# Patient Record
Sex: Male | Born: 1960 | Race: White | Hispanic: No | Smoking: Never smoker
Health system: Southern US, Community
[De-identification: ages and names within clinical notes are randomized; demographics above are authoritative.]

## PROBLEM LIST (undated history)

## (undated) DIAGNOSIS — I639 Cerebral infarction, unspecified: Secondary | ICD-10-CM

## (undated) DIAGNOSIS — Q2112 Patent foramen ovale: Secondary | ICD-10-CM

## (undated) DIAGNOSIS — E785 Hyperlipidemia, unspecified: Secondary | ICD-10-CM

## (undated) DIAGNOSIS — I10 Essential (primary) hypertension: Secondary | ICD-10-CM

## (undated) DIAGNOSIS — C499 Malignant neoplasm of connective and soft tissue, unspecified: Secondary | ICD-10-CM

## (undated) DIAGNOSIS — Z85528 Personal history of other malignant neoplasm of kidney: Secondary | ICD-10-CM

## (undated) DIAGNOSIS — N289 Disorder of kidney and ureter, unspecified: Secondary | ICD-10-CM

## (undated) DIAGNOSIS — C4490 Unspecified malignant neoplasm of skin, unspecified: Secondary | ICD-10-CM

## (undated) HISTORY — DX: Cerebral infarction, unspecified: I63.9

## (undated) HISTORY — DX: Personal history of other malignant neoplasm of kidney: Z85.528

## (undated) HISTORY — DX: Essential (primary) hypertension: I10

## (undated) HISTORY — DX: Patent foramen ovale: Q21.12

## (undated) HISTORY — DX: Malignant neoplasm of connective and soft tissue, unspecified: C49.9

## (undated) HISTORY — DX: Disorder of kidney and ureter, unspecified: N28.9

## (undated) HISTORY — DX: Hyperlipidemia, unspecified: E78.5

## (undated) HISTORY — PX: PARTIAL NEPHRECTOMY: SHX414

## (undated) HISTORY — PX: OTHER SURGICAL HISTORY: SHX169

## (undated) NOTE — Discharge Instructions (Signed)
 Formatting of this note is different from the original.Images from the original note were not included.[id number] en Transient Ischemic Attack (TIA)  Your symptoms were caused by a TIA, or mini-stroke. Even though your symptoms have gone away, this condition is as serious as a full stroke. It is a warning signal that means once you have had a TIA you are at a higher risk of having a full stroke. The risk of a stroke after a TIA is highest within the first 24 to 48 hours. A TIA is caused when something decreases or blocks blood flow to a part of your brain. A TIA often happens when a blood clot travels to a blood vessel in the brain. The clot reduces or blocks blood flow causing the symptoms you have experienced. After a short while, the clot dissolves. Blood flows again, and the symptoms go away. People with hardening of the arteries (atherosclerosis) or an irregular heartbeat called atrial fibrillation are at higher risk for a TIA. A TIA causes symptoms similar to a stroke, but they last less than 24 hours. A full stroke causes symptoms that last more than 24 hours and may be permanent. But even if your symptoms only lasted a short time, the TIA may have damaged your brain tissue. You will need tests to look at the blood flow to your brain. The tests can also rule out other causes of your symptoms. The tests may include an ultrasound of the arteries in your neck and an evaluation of your heart. They may also include a CT scan of your brain, an MRI scan of your brain, or both. If your healthcare provider finds problems, they will recommend treatment with medicines, procedures, or both. Your provider may prescribe medicines to reduce your chance of having another TIA and stroke. These may include medicines that prevent blood clots, such as antiplatelet medicines and blood thinners (anticoagulants). Your healthcare provider may recommend other treatments. This may include a procedure to open up a blocked  artery in your neck or a procedure to prevent blood clots from forming in the heart. Home care These guidelines will help you take care of yourself at home: ? Take any medicines your healthcare provider has prescribed as directed. These may include antiplatelet medicines or medicines for other conditions, such as high blood pressure or high cholesterol. ? A TIA is a serious event that puts you at risk of having a full stroke. Because of this, it's important to take steps to help prevent a stroke from happening. Your provider will look at all of your risk factors when deciding on what other treatment you may need. Ways to reduce your risk for stroke High blood pressure, diabetes, high cholesterol, heavy drinking, and smoking are risk factors for stroke and heart disease. You can control these by taking medicines and making diet and lifestyle changes. One way to help prevent a stroke is to take aspirin or a similar medicine every day. However, you should not take daily aspirin unless your healthcare provider tells you to. Your provider will work with you to make lifestyle changes to help prevent a stroke. Diet Your healthcare provider will give you information about changes you may need to make to your diet. You may need to see a registered dietitian for help with diet changes. Changes may include: ? Eating less fat and cholesterol ? Eating less salt (sodium). This is especially important if you have high blood pressure. ? Eating more fresh fruits and vegetables ? Eating lean proteins,  such as fish, poultry, beans, and peas ? Eating less red meat and processed meats ? Using low-fat dairy products ? Using vegetable and nut oils in limited amounts ? Limiting how many sweets and processed foods, such as chips, cookies, and baked goods you eat ? Limiting how much alcohol you drink Physical activity Your healthcare provider may recommend that you get more exercise if you have not  been as active as possible. They may suggest that you get 40 minutes of moderate to vigorous physical activity each day. You should do this at least 3 to 4 days a week. A few examples of moderate to vigorous exercise are: ? Walking at a brisk pace, about 3 to 4 miles per hour ? Jogging or running ? Swimming or water aerobics ? Hiking ? Dancing ? Martial arts ? Tennis ? Riding a bike Other ways to reduce your risk ? Weight management. If you are overweight or obese, your healthcare provider will work with you to lose weight and lower your body mass index (BMI) to a normal or near-normal level. Making diet changes and increasing physical activity can help. ? Smoking. If you smoke, break the habit. Enroll in a stop smoking program to improve your chances of success. ? Stress. Learn how to manage your stress. If necessary, work with a financial trader who can help you develop coping strategies that can help you deal with stress at home and at work. Follow-up care Call your healthcare provider for an appointment in the next few days for another evaluation, or as advised. This is to make a plan for preventing another TIA or stroke. You may need to see a neurologist to follow up on your TIA. A neurologist is a healthcare provider who specializes in treating brain and nervous system problems. You may need other tests or procedures. If you had an X-ray, CT scan, MRI scan, or ECG (electrocardiogram), a specialist will review it. You?ll be told of any new findings that will affect your care. Call 911 Call 911 if any of these occur: ? Any of your TIA symptoms return ? New problems with speech, vision, walking, or weakness or numbness of the face or on 1 side of the body ? Severe headache, fainting spell, dizziness, or seizure Use B.E. F.A.S.T. to help you remember the symptoms of a stroke: ? B for balance. Sudden loss of balance or coordination. ? E for eyes. Vision  changes in 1 or both eyes. ? F for face drooping. Drooping or numbness on 1 side of face. This may be more noticeable when you ask the affected person to smile. ? A for arm weakness or numbness. The affected person may have trouble using or lifting 1 side. ? S for speech difficulty. Speech may be slurred or hard to understand. The affected person may also use the wrong words. ? T for time to call 911. Time is critical in treating a stroke. Call 911 as soon as you suspect a stroke has happened--even a small one. The sooner treatment is started the better, even if the symptoms go away. Last Reviewed Date: 12/27/2022 00:00:00  2000-2025 [employer], [place]. All rights reserved. This information is not intended as a substitute for professional medical care. Always follow your healthcare professional's instructions. Electronically signed by Prentice Pollen, PA at 02/11/2024  3:07 PM EST

## (undated) NOTE — Discharge Instructions (Signed)
 Formatting of this note is different from the original.Images from the original note were not included.[id number] Discharge Instructions for Transient Ischemic Attack (TIA) You have been diagnosed with a transient ischemic attack (TIA). A TIA is also known as a mini-stroke. This happens when blood could not reach part of your brain for a short period of time. Unlike a stroke, a TIA does not usually cause lasting damage. If you think you are having symptoms of a TIA or stroke, get medical help right away. Do this even if your symptoms go away. Prevention ? Take your medicines exactly as directed. Don?t skip doses. ? Learn to take your blood pressure. Write down the numbers and tell your doctor. ? Learn your cholesterol level. Follow your doctor?s advice about how to keep cholesterol under control. Make these lifestyle changes: ? Quit smoking. Join a stop-smoking program to improve your chances of success. Ask your doctor about medicines or other methods to help you quit. ? Limit your alcohol. Don't have more than 2 drinks a day if you're a man and no more than 1 drink a day if you're a woman. ? Keep a healthy weight. Get help to lose any extra pounds. If you are overweight, your doctor will work with you to lose weight and lower your body mass index (BMI) to a normal or near-normal level. Making diet changes and increasing physical activity can help. ? Start an exercise program. Ask your doctor how to get started and how much activity you should try to get on a daily or weekly basis. You can benefit from simple activities, such as walking or gardening. ? Learn ways to manage your stress. There are many methods to manage stress. They can help you deal with stress in your home and work life. You may also need to change what you eat. Your doctor may refer you to a registered dietitian for help with diet changes. These changes may include: ? Eating less fat and cholesterol. ? Having less  salt (sodium), especially if you have high blood pressure. ? Eating more fresh vegetables and fruits. ? Eating lean proteins, such as fish, poultry, and legumes (beans and peas). ? Eating less red meat and processed meats. ? Choosing low-fat dairy products. ? Using vegetable and nut oils in small amounts. ? Limiting sweet and processed foods, such as chips, cookies, and baked goods. To cut back on sodium: ? Limit the amount of canned, dried, packaged, and fast foods you eat. ? Don?t add salt to your food. ? Season foods with herbs instead of salt when you cook. Follow-up care If you are taking certain medicines, you may need blood tests to check for progress or problems. Have blood tests as often as prescribed by your doctor. Call 911 Call 911 right away if: ? You have weakness, tingling, or loss of feeling on 1 side of your face or body. ? You have sudden double vision, or trouble seeing in 1 or both eyes. ? You have sudden trouble talking, or slurring your speech. ? You have trouble understanding other people speaking. ? You have sudden, severe headache. ? You have dizziness, loss of balance, a spinning feeling, or a sense of falling. ? You have blackouts. ? You have seizures. B.E. F.A.S.T. is an easy way to remember the signs of stroke. When you see these signs, you know that you need to call 911 fast. B.E. F.A.S.T. stands for: ? B is for balance. Sudden loss of balance or coordination. ? E is  for eyes. Vision changes in 1 or both eyes. ? F is for face drooping. One side of the face is drooping or numb. When the person smiles, the smile is uneven. ? A is for arm weakness. One arm is weak or numb. When the person lifts both arms at the same time, 1 arm may drift downward. ? S is for speech difficulty. You may notice slurred speech or trouble speaking. The person can't repeat a simple sentence correctly when asked. ? T is for time to call 911. If  someone shows any of these symptoms, even if they go away, call 911 right away. Make note of the time the symptoms first appeared. Last Reviewed Date: 05/27/2023 00:00:00  2000-2025 [employer], [place]. All rights reserved. This information is not intended as a substitute for professional medical care. Always follow your healthcare professional's instructions. Electronically signed by Prentice Pollen, PA at 02/11/2024  3:07 PM EST

## (undated) NOTE — ED Provider Notes (Signed)
 Formatting of this note is different from the original.Chief Complaint Patient presents with  Stroke   At 1100 patient suddenly developed dizziness/tunnel vision to both eyes and nausea. Patient has history of CVA x2, Left foot numbness residuals. Patient is a Harry Fleming with history of 2 prior CVAs, hypertension, residual left foot numbness from past stroke as well as right hand numbness from previous traumatic accidents.  He presents for complaint of visual field deficits and dizziness.  He was skiing at around 11 AM when he noticed a sensation of tunnel vision to bilateral eyes as well as dizziness/nausea.  He says this feels persistent now like he does not have good peripheral vision, his central vision he says is normal.  Denies any headache or neck pain, chest pain or difficulty breathing, numbness weakness to the extremities, fever or chills, back pain.Review of Systems All other systems reviewed and are negative.Physical ExamVitals and nursing note reviewed. Constitutional:     General: He is not in acute distress.   Appearance: He is well-developed. HENT:    Head: Normocephalic and atraumatic. Eyes:    General: Lids are normal. Vision grossly intact. Gaze aligned appropriately.    Extraocular Movements: Extraocular movements intact.    Conjunctiva/sclera: Conjunctivae normal.    Pupils: Pupils are equal, round, and reactive to light.    Comments: PERRL, EOMs intact, vision is grossly intact, I do not appreciate any visual field deficits on exam. Cardiovascular:    Rate and Rhythm: Normal rate and regular rhythm.    Heart sounds: No murmur heard.Pulmonary:    Effort: Pulmonary effort is normal. No respiratory distress.    Breath sounds: Normal breath sounds. Abdominal:    Palpations: Abdomen is soft.    Tenderness: There is no abdominal tenderness. Musculoskeletal:       General: No swelling.    Cervical back: Neck supple.  Skin:   General: Skin is warm and dry.    Capillary Refill: Capillary refill takes less than 2 seconds. Neurological:    General: No focal deficit present.    Mental Status: He is alert and oriented to person, place, and time.    GCS: GCS eye subscore is 4. GCS verbal subscore is 5. GCS motor subscore is 6.    Cranial Nerves: Cranial nerves 2-12 are intact. No dysarthria or facial asymmetry.    Sensory: Sensation is intact.    Motor: Motor function is intact. No abnormal muscle tone.    Coordination: Coordination is intact. Finger-Nose-Finger Test normal. Psychiatric:       Mood and Affect: Mood normal. Current Medications:Current Medications[1] Allergies:Allergies[2] Medical History[3]Surgical History[4]Problem List[5]Family History[6]Social History Tobacco Use  Smoking status: Never  Smokeless tobacco: Never Vaping Use  Vaping status: Never Used Substance Use Topics  Alcohol use: Yes   Alcohol/week: 3.0 standard drinks of alcohol   Types: 3 Shots of liquor per week  Drug use: Never Vitals:  02/11/24 1300 02/11/24 1315 02/11/24 1400 02/11/24 1445 BP: 140/87 (!) 137/91 137/90 (!) 142/77 BP Location: Left arm   Left arm Patient Position: Sitting   Sitting Pulse: 79 78 79 81 Resp: 13 15 18 16  Temp:     TempSrc:     SpO2: 98% 97% 99% 96% Weight:     Height:     Medical Decision Making66 year old Fleming with history of stroke presenting for somewhat sudden onset of tunnel vision with sensation of poor peripheral vision bilaterally as well as some dizziness and nausea.  He is minimally hypertensive, I do not  appreciate any objective visual field deficit and there are no other signs or symptoms of stroke on exam, given his history plan for CT CTA head and neck and MRI as well as labs and EKGEKG sinus rhythm with no ischemic changes.  Labs unremarkable for any hematologic or metabolic derangements, no  leukocytosis or anemia, thrombocytopenia, no renal dysfunction or electrolyte derangements.  Troponin negative.  CT head and CTA head and neck negative for any evidence of any significant plaque, stenosis, bleed or other abnormality, MRI of the brain is clear as well without evidence of stroke.  His symptoms have entirely resolved and has no other concerning neurologic deficits.  Certainly could have been caused by TIA or other unspecified visual disturbance however is asymptomatic at this time and do not feel that he requires admission for further evaluation.  He is returning to [facility] which is his home soon and has cardiology, neurology and other specialist to follow-up with.  He is appropriately on aspirin daily and a statin.  He was discharged with strict instructions and return precautions.MR Brain WO Contrast Final Result No acute intracranial pathology. Dictated by: Toribio Loyal  Signed by: Toribio Loyal on 02/11/2024 2:42 PM CT Head And CTA Head Neck [[id number]] Final Result No apparent large vessel occlusion or significant arterial stenosis. Limited evaluation of the petrous and cavernous segments of the ICAs. No apparent intracranial pathology. Dictated by: Alm Sick  Signed by: Alm Sick on 02/11/2024 1:15 PM Consults: ED Course: ED Course as of 02/11/24 1510 Fri Feb 11, 2024 1304 ECG 12 lead80 bpm normal sinus rhythm with no acute ST changes [AS] 1321 Reevaluated patient after CTs, patient has had resolution of his symptoms at this point.  CT CTA head and neck are unremarkable for any LVO, bleed or other acute pathology.  Pending MRI brain [AS] ED Course User Index[AS] Prentice Pollen, PA Diagnoses as of 02/11/24 1510 TIA (transient ischemic attack) Visual disturbance Medications sodium chloride  (NS) 0.9 % flush 5 mL (has no administration in time range) sodium chloride  (NS) 0.9 % flush 5 mL (has no administration in  time range) iohexol (OMNIPaque) 350 MG/ML injection 75 mL (75 mL Intravenous Given 02/11/24 1244) Assessment & PlanTIA (transient ischemic attack)Visual disturbanceED Prescriptions  None      Glasgow Coma Scale Score: 15[1] Current Facility-Administered Medications:   [COMPLETED] Insert peripheral IV, , , Once **AND** [COMPLETED] Saline lock IV, , , Once **AND** sodium chloride  (NS) 0.9 % flush 5 mL, 5 mL, Intravenous, q8h PRN, Prentice Pollen, PA  [COMPLETED] Insert peripheral IV, , , Once **AND** [COMPLETED] Saline lock IV, , , Once **AND** sodium chloride  (NS) 0.9 % flush 5 mL, 5 mL, Intravenous, q8h PRN, Prentice Pollen, PACurrent Outpatient Medications:   aspirin 81 MG EC tablet, Take 81 mg by mouth daily at bedtime., Disp: , Rfl:   atorvastatin  (Lipitor) 80 MG tablet, Take 80 mg by mouth daily at bedtime., Disp: , Rfl:   valsartan  (Diovan ) 160 MG tablet, Take 160 mg by mouth daily at bedtime., Disp: , Rfl: [2] No Known Allergies[3] Past Medical History:Diagnosis Date  Hyperlipidemia   Hypertension   Stroke  (CMS/HHS HCC)  [4] Past Surgical History:Procedure Laterality Date  HAND SURGERY Right   PARTIAL NEPHRECTOMY Left   PATENT FORAMEN OVALE CLOSURE   [5] There is no problem list on file for this patient. [6] No family history on file.Prentice Pollen, PA01/16/26 1510Cosigned by Lorette Lucks, MD at 02/11/2024  4:59 PM ESTElectronically signed by Prentice Pollen, PA at  02/11/2024  3:10 PM ESTElectronically signed by Lorette Lucks, MD at 02/11/2024  4:59 PM EST

---

## 2015-01-27 DIAGNOSIS — G459 Transient cerebral ischemic attack, unspecified: Secondary | ICD-10-CM

## 2015-01-27 HISTORY — DX: Transient cerebral ischemic attack, unspecified: G45.9

## 2015-07-11 ENCOUNTER — Encounter (HOSPITAL_COMMUNITY): Payer: Self-pay | Admitting: Emergency Medicine

## 2015-07-11 ENCOUNTER — Emergency Department (HOSPITAL_COMMUNITY): Payer: 59

## 2015-07-11 ENCOUNTER — Emergency Department (HOSPITAL_COMMUNITY)
Admission: EM | Admit: 2015-07-11 | Discharge: 2015-07-11 | Disposition: A | Discharge status: Home / Self Care | Payer: 59 | Attending: Emergency Medicine | Admitting: Emergency Medicine

## 2015-07-11 DIAGNOSIS — M6281 Muscle weakness (generalized): Secondary | ICD-10-CM | POA: Diagnosis not present

## 2015-07-11 DIAGNOSIS — R51 Headache: Secondary | ICD-10-CM

## 2015-07-11 DIAGNOSIS — I10 Essential (primary) hypertension: Secondary | ICD-10-CM | POA: Insufficient documentation

## 2015-07-11 DIAGNOSIS — G459 Transient cerebral ischemic attack, unspecified: Secondary | ICD-10-CM

## 2015-07-11 DIAGNOSIS — Z85828 Personal history of other malignant neoplasm of skin: Secondary | ICD-10-CM | POA: Insufficient documentation

## 2015-07-11 DIAGNOSIS — R531 Weakness: Secondary | ICD-10-CM

## 2015-07-11 DIAGNOSIS — Z5181 Encounter for therapeutic drug level monitoring: Secondary | ICD-10-CM | POA: Diagnosis not present

## 2015-07-11 DIAGNOSIS — R29898 Other symptoms and signs involving the musculoskeletal system: Secondary | ICD-10-CM

## 2015-07-11 DIAGNOSIS — R2990 Unspecified symptoms and signs involving the nervous system: Secondary | ICD-10-CM

## 2015-07-11 DIAGNOSIS — R200 Anesthesia of skin: Secondary | ICD-10-CM | POA: Diagnosis present

## 2015-07-11 HISTORY — DX: Unspecified malignant neoplasm of skin, unspecified: C44.90

## 2015-07-11 HISTORY — DX: Essential (primary) hypertension: I10

## 2015-07-11 LAB — I-STAT CHEM 8, ED
BUN: 15 mg/dL (ref 6–20)
CHLORIDE: 99 mmol/L — AB (ref 101–111)
CREATININE: 1 mg/dL (ref 0.61–1.24)
Calcium, Ion: 1.16 mmol/L (ref 1.12–1.23)
GLUCOSE: 107 mg/dL — AB (ref 65–99)
HEMATOCRIT: 42 % (ref 39.0–52.0)
HEMOGLOBIN: 14.3 g/dL (ref 13.0–17.0)
POTASSIUM: 4 mmol/L (ref 3.5–5.1)
Sodium: 138 mmol/L (ref 135–145)
TCO2: 27 mmol/L (ref 0–100)

## 2015-07-11 LAB — COMPREHENSIVE METABOLIC PANEL
ALBUMIN: 3.9 g/dL (ref 3.5–5.0)
ALT: 23 U/L (ref 17–63)
AST: 22 U/L (ref 15–41)
Alkaline Phosphatase: 51 U/L (ref 38–126)
Anion gap: 5 (ref 5–15)
BILIRUBIN TOTAL: 0.7 mg/dL (ref 0.3–1.2)
BUN: 13 mg/dL (ref 6–20)
CALCIUM: 9.2 mg/dL (ref 8.9–10.3)
CHLORIDE: 102 mmol/L (ref 101–111)
CO2: 28 mmol/L (ref 22–32)
Creatinine, Ser: 1.07 mg/dL (ref 0.61–1.24)
GFR calc Af Amer: >60 mL/min (ref >60)
Glucose, Bld: 111 mg/dL — ABNORMAL HIGH (ref 65–99)
POTASSIUM: 3.8 mmol/L (ref 3.5–5.1)
Sodium: 135 mmol/L (ref 135–145)
Total Protein: 6.7 g/dL (ref 6.5–8.1)

## 2015-07-11 LAB — CBC
HEMATOCRIT: 40.6 % (ref 39.0–52.0)
HEMOGLOBIN: 13.4 g/dL (ref 13.0–17.0)
MCH: 28.6 pg (ref 26.0–34.0)
MCHC: 33 g/dL (ref 30.0–36.0)
MCV: 86.8 fL (ref 78.0–100.0)
Platelets: 253 10*3/uL (ref 150–400)
RBC: 4.68 MIL/uL (ref 4.22–5.81)
RDW: 13.2 % (ref 11.5–15.5)
WBC: 6 10*3/uL (ref 4.0–10.5)

## 2015-07-11 LAB — I-STAT TROPONIN, ED: Troponin i, poc: 0 ng/mL (ref 0.00–0.08)

## 2015-07-11 LAB — DIFFERENTIAL
BASOS ABS: 0 10*3/uL (ref 0.0–0.1)
Basophils Relative: 1 %
EOS ABS: 0.1 10*3/uL (ref 0.0–0.7)
Eosinophils Relative: 2 %
LYMPHS ABS: 1.9 10*3/uL (ref 0.7–4.0)
LYMPHS PCT: 32 %
MONOS PCT: 10 %
Monocytes Absolute: 0.6 10*3/uL (ref 0.1–1.0)
NEUTROS ABS: 3.4 10*3/uL (ref 1.7–7.7)
NEUTROS PCT: 55 %

## 2015-07-11 LAB — ETHANOL: Alcohol, Ethyl (B): <5 mg/dL (ref <5)

## 2015-07-11 LAB — PROTIME-INR
INR: 1.11 (ref 0.00–1.49)
Prothrombin Time: 14.4 seconds (ref 11.6–15.2)

## 2015-07-11 LAB — APTT: APTT: 29 s (ref 24–37)

## 2015-07-11 LAB — CBG MONITORING, ED: GLUCOSE-CAPILLARY: 112 mg/dL — AB (ref 65–99)

## 2015-07-11 MED ORDER — PROCHLORPERAZINE EDISYLATE 5 MG/ML IJ SOLN: 10.0000 mg | Freq: Once | INTRAMUSCULAR | Status: DC

## 2015-07-11 MED ORDER — DIPHENHYDRAMINE HCL 50 MG/ML IJ SOLN: 25.0000 mg | Freq: Once | INTRAMUSCULAR | Status: DC

## 2015-07-11 MED ORDER — IOPAMIDOL (ISOVUE-370) INJECTION 76%
INTRAVENOUS | Status: AC
  Filled 2015-07-11: qty 100

## 2015-07-11 MED ORDER — IOPAMIDOL (ISOVUE-370) INJECTION 76%
75.0000 mL | Freq: Once | INTRAVENOUS | Status: AC | PRN
  Administered 2015-07-11: 75 mL via INTRAVENOUS

## 2015-07-11 MED ORDER — IOHEXOL 300 MG/ML  SOLN: 75.0000 mL | Freq: Once | INTRAMUSCULAR | Status: DC | PRN

## 2015-07-11 NOTE — Code Documentation (Signed)
55 year old presents to [facility] via [place] from PCP office with stroke like symptoms.  On arrival code stroke was called by EDP.  Patient reports he was driving when he had a sudden onset of left arm numbness - he was near his MD office so he stopped there.  When getting out of car left leg felt weak - difficulty walking and dizzy.  Then his left hand began to tingle.  His NIHSS is 3 - left arm drift when eyes shut per Dr. Cheral Marker exam - left leg drift and left side sensory decreased.  BP 146/95 HR 73 SR - CBG 112.  Denies hx of migraines - no hx afib.  To MRI for DWI - Dr. Cheral Marker speaking with family and patient re: tPA.  Dr. Cheral Marker to MRI and reports  DWI negative - will continue with MRA head only per Dr. Cheral Marker.  Back to ED - Dr. Cheral Marker cancelled code stroke.   Handoff to [employer].  To call as needed.

## 2015-07-11 NOTE — ED Provider Notes (Signed)
CSN: [id number]     Arrival date & time 07/11/15  1608 History  By signing my name below, I, [facility], attest that this documentation has been prepared under the direction and in the presence of Deno Etienne, DO. Electronically Signed: Virgel Bouquet, ED Scribe. 07/11/2015. 7:12 PM.   Chief Complaint  Patient presents with  . Numbness    left arm    The history is provided by the patient. No language interpreter was used.   HPI Comments: Angel Adams is a 55 y.o. male with an hx of HTN who presents to the Emergency Department complaining of constant, gradually improving, waxing and waning, left arm numbness that radiates down his entire arm onset 1.25 hours ago. He describes his left arm as feeling heavy with tingling and pressure. He reports associated dizziness that he describes as feeling off balance, nausea, and HA en route to the ED today. Dizziness is unchanged by movement.  Denies recent falls or injuries. Denies taking blood thinners. Denies left arm weakness, neck pain, tinnitus, or any other symptoms currently.  Past Medical History  Diagnosis Date  . Hypertension   . Skin cancer    Past Surgical History  Procedure Laterality Date  . Mass removed from shoulder     No family history on file. Social History  Substance Use Topics  . Smoking status: Never Smoker   . Smokeless tobacco: None  . Alcohol Use: Yes     Comment: occassional    Review of Systems  Constitutional: Negative for fever and chills.  HENT: Negative for congestion, facial swelling and tinnitus.   Eyes: Negative for discharge and visual disturbance.  Respiratory: Negative for shortness of breath.   Cardiovascular: Negative for chest pain and palpitations.  Gastrointestinal: Positive for nausea. Negative for vomiting, abdominal pain and diarrhea.  Musculoskeletal: Negative for myalgias, arthralgias and neck pain.  Skin: Negative for color change and rash.  Neurological: Positive for  dizziness, numbness and headaches. Negative for tremors, syncope and weakness.  Psychiatric/Behavioral: Negative for confusion and dysphoric mood.  All other systems reviewed and are negative.     Allergies  Review of patient's allergies indicates no known allergies.  Home Medications   Prior to Admission medications   Not on File   BP 134/101 mmHg  Pulse 73  Temp(Src) 97.4 F (36.3 C) (Oral)  Resp 20  SpO2 98% Physical Exam  Constitutional: He is oriented to person, place, and time. He appears well-developed and well-nourished.  HENT:  Head: Normocephalic and atraumatic.  Eyes: EOM are normal. Pupils are equal, round, and reactive to light.  Neck: Normal range of motion. Neck supple. No JVD present.  Cardiovascular: Normal rate and regular rhythm.  Exam reveals no gallop and no friction rub.   No murmur heard. Normal rhythm.  Pulmonary/Chest: No respiratory distress. He has no wheezes.  Abdominal: He exhibits no distension. There is no rebound and no guarding.  Musculoskeletal: Normal range of motion.  Neurological: He is alert and oriented to person, place, and time.  4/5 strength in left upper and lower extremities. Reflexes 2+.  Skin: No rash noted. No pallor.  Psychiatric: He has a normal mood and affect. His behavior is normal.  Nursing note and vitals reviewed.   ED Course  Procedures (including critical care time)  DIAGNOSTIC STUDIES: Oxygen Saturation is 98% on RA, normal by my interpretation.    COORDINATION OF CARE: 4:13 PM Called code stroke. Will order head CT and labs. Discussed treatment plan with  pt at bedside and pt agreed to plan.  6:14 PM Consulted with Neurology Dr. Cheral Marker who advised that the pt did not need to be admitted to the hospital for further evaluation. Will discharge pt and advise follow-up with his PCP and Neurology. Will return to discuss treatment plan with pt.  Labs Review Labs Reviewed  COMPREHENSIVE METABOLIC PANEL - Abnormal;  Notable for the following:    Glucose, Bld 111 (*)    All other components within normal limits  I-STAT CHEM 8, ED - Abnormal; Notable for the following:    Chloride 99 (*)    Glucose, Bld 107 (*)    All other components within normal limits  CBG MONITORING, ED - Abnormal; Notable for the following:    Glucose-Capillary 112 (*)    All other components within normal limits  ETHANOL  PROTIME-INR  APTT  CBC  DIFFERENTIAL  URINE RAPID DRUG SCREEN, HOSP PERFORMED  URINALYSIS, ROUTINE W REFLEX MICROSCOPIC (NOT AT [facility])  I-STAT TROPOININ, ED    Imaging Review Ct Angio Neck W/cm &/or Wo/cm  07/11/2015  CLINICAL DATA:  Left arm weakness.  Code stroke. EXAM: CT ANGIOGRAPHY NECK TECHNIQUE: Multidetector CT imaging of the neck was performed using the standard protocol during bolus administration of intravenous contrast. Multiplanar CT image reconstructions and MIPs were obtained to evaluate the vascular anatomy. Carotid stenosis measurements (when applicable) are obtained utilizing NASCET criteria, using the distal internal carotid diameter as the denominator. CONTRAST:  75 mL Isovue 370 IV COMPARISON:  CT head and MRI head 07/11/2015 FINDINGS: Aortic arch: Normal aortic arch.  Proximal great vessels are normal. Right carotid system: Normal right carotid. Negative for atherosclerotic disease or dissection. Tortuosity of the cervical internal carotid. Left carotid system: Normal left carotid. Negative for atherosclerotic disease or dissection. Tortuosity of the internal carotid artery. Vertebral arteries:Normal vertebral artery bilaterally without stenosis or dissection. Skeleton: Negative Other neck: No mass or adenopathy in the neck. Paranasal sinuses clear. IMPRESSION: Negative CTA neck. Electronically Signed   By: Franchot Gallo M.D.   On: 07/11/2015 18:59   Mr Jodene Nam Head Wo Contrast  07/11/2015  CLINICAL DATA:  Stroke-like symptoms.  Left arm numbness EXAM: MRI HEAD WITHOUT CONTRAST MRA HEAD WITHOUT  CONTRAST TECHNIQUE: Multiplanar, multiecho pulse sequences of the brain and surrounding structures were obtained without intravenous contrast. Angiographic images of the head were obtained using MRA technique without contrast. COMPARISON:  CT 07/11/2015 FINDINGS: MRI HEAD FINDINGS Imaging limited to diffusion weighted imaging only. Negative for acute infarct. Normal ventricle size. Normal diffusion-weighted imaging. MRA HEAD FINDINGS Both vertebral arteries appear normal. PICA, superior cerebellar, posterior cerebral arteries appear normal. Basilar is normal Internal carotid artery is normal bilaterally. Anterior and middle cerebral arteries are normal bilaterally Negative for cerebral aneurysm. IMPRESSION: Negative for acute infarct.  Limited imaging of the brain. Normal MRA circle of Willis. Electronically Signed   By: Franchot Gallo M.D.   On: 07/11/2015 18:11   Mr Brain Ltd W/o Cm  07/11/2015  CLINICAL DATA:  Stroke-like symptoms.  Left arm numbness EXAM: MRI HEAD WITHOUT CONTRAST MRA HEAD WITHOUT CONTRAST TECHNIQUE: Multiplanar, multiecho pulse sequences of the brain and surrounding structures were obtained without intravenous contrast. Angiographic images of the head were obtained using MRA technique without contrast. COMPARISON:  CT 07/11/2015 FINDINGS: MRI HEAD FINDINGS Imaging limited to diffusion weighted imaging only. Negative for acute infarct. Normal ventricle size. Normal diffusion-weighted imaging. MRA HEAD FINDINGS Both vertebral arteries appear normal. PICA, superior cerebellar, posterior cerebral arteries appear normal.  Basilar is normal Internal carotid artery is normal bilaterally. Anterior and middle cerebral arteries are normal bilaterally Negative for cerebral aneurysm. IMPRESSION: Negative for acute infarct.  Limited imaging of the brain. Normal MRA circle of Willis. Electronically Signed   By: Franchot Gallo M.D.   On: 07/11/2015 18:11   Ct Head Code Stroke Wo Contrast  07/11/2015   CLINICAL DATA:  Code stroke, dizziness and RIGHT arm weakness onset at 1500 hours today, history hypertension EXAM: CT HEAD WITHOUT CONTRAST TECHNIQUE: Contiguous axial images were obtained from the base of the skull through the vertex without intravenous contrast. COMPARISON:  None FINDINGS: Normal ventricular morphology. No midline shift or mass effect. Minimal small vessel chronic ischemic changes of deep cerebral white matter superiorly. Otherwise normal appearance of brain parenchyma. No intracranial hemorrhage, mass lesion or evidence acute infarction. No extra-axial fluid collections. Visualized paranasal sinuses and mastoid air cells clear. Bones unremarkable. IMPRESSION: Minimal small vessel chronic ischemic changes of deep cerebral white matter. No acute intracranial abnormalities. Findings called to Dr.  Tyrone Nine On 07/11/2015 at 1652 hours. Electronically Signed   By: Lavonia Dana M.D.   On: 07/11/2015 17:07   I have personally reviewed and evaluated these images and lab results as part of my medical decision-making.   MDM   Final diagnoses:  Left arm weakness    55 yo M With left arm and left leg weakness. Feels like it's when he got a block for shoulder surgery. Denies any injury. Occurred suddenly. Code stroke was initiated. He was seen by neurology. Deemed to not be a stroke negative MRI MRA of the head. Neurology recommends a CT angiogram of the neck to evaluate for possible dissection. They feel that this is likely a complex migraine as the patient also had a headache with the symptoms. On my reassessment the patient is feeling much better. No longer complaining of any weakness or numbness to the arm. CT angiogram of the neck was negative. Discussed inpatient vs outpatient therapy. Will discharge the patient home. Given follow-up for possible outpatient TIA workup. Also given neurology contact if this reoccurs.   7:22 PM:  I have discussed the diagnosis/risks/treatment options with the  patient and family and believe the pt to be eligible for discharge home to follow-up with PCP, neuro. We also discussed returning to the ED immediately if new or worsening sx occur. We discussed the sx which are most concerning (e.g., sudden worsening pain, weakness, other stroke like symptoms) that necessitate immediate return. Medications administered to the patient during their visit and any new prescriptions provided to the patient are listed below.  Medications given during this visit Medications  prochlorperazine (COMPAZINE) injection 10 mg (not administered)  diphenhydrAMINE (BENADRYL) injection 25 mg (not administered)  iohexol (OMNIPAQUE) 300 MG/ML solution 75 mL (not administered)  iopamidol (ISOVUE-370) 76 % injection (not administered)  iopamidol (ISOVUE-370) 76 % injection 75 mL (75 mLs Intravenous Contrast Given 07/11/15 1830)    New Prescriptions   No medications on file    The patient appears reasonably screen and/or stabilized for discharge and I doubt any other medical condition or other [facility] requiring further screening, evaluation, or treatment in the ED at this time prior to discharge.    I personally performed the services described in this documentation, which was scribed in my presence. The recorded information has been reviewed and is accurate.     Deno Etienne, DO 07/11/15 [place]

## 2015-07-11 NOTE — ED Notes (Signed)
EMS - Patient c/o of numbness and tingling and pressure in the left arm.  Stroke screen passed.  LKN at 1500, discovered at 1500.  Patient was driving when the symptoms started.  No recent trauma.  Denies SOB, nausea and vomiting.

## 2015-07-11 NOTE — ED Notes (Signed)
Placed patient into gown and on monitor

## 2015-07-11 NOTE — ED Notes (Signed)
Patient alert and oriented at discharge, family present.  Patient assisted to the waiting room via wheelchair.  Patient denies pain at time at discharge.

## 2015-07-11 NOTE — Discharge Instructions (Signed)
He may have had a complicated migraine. The neurologist that saw you did not feel like this was a stroke. To be thorough you likely need to have a mini stroke workup. This typically consists of a Holter monitor, carotid duplex studies, and an echocardiogram. Discussed this with your family physician. Call and talk to them tomorrow.

## 2015-07-11 NOTE — Consult Note (Signed)
NEURO HOSPITALIST CONSULT NOTE   Requestig physician: Dr. Tyrone Nine  Reason for Consult: Acute onset of LUE weakness  History obtained from:  Patient and Chart     HPI:                                                                                                                                          Angel Adams is an 55 y.o. male who presented with LUE and LLE numbness and weakness. Time of onset was 2:55 PM. He was unsteady when ambulating due to the leg weakness, but denies vertigo or presyncope. Also with some left facial numbness. Denies speech deficit or vision loss. He described to the ED team his left arm as "feeling heavy with tingling and pressure". He reported associated dizziness that he described as feeling off balance due to the weakness, but again, without a spinning sensation. Endorsed nausea, and HA en route to the ED.   States that he has a prior history of HTN. No prior history of stroke, ICH, CAD or DM.   Past Medical History  Diagnosis Date  . Hypertension   . Skin cancer     Past Surgical History  Procedure Laterality Date  . Mass removed from shoulder      No family history on file.  Social History:  reports that he has never smoked. He does not have any smokeless tobacco history on file. He reports that he drinks alcohol. His drug history is not on file.  No Known Allergies  MEDICATIONS:                                                                                                                     Endorses taking an ARB for his HTN.  ROS:  History obtained from the patient  General ROS: negative for - chills, fatigue, fever, night sweats, weight gain or weight loss Psychological ROS: negative for - behavioral disorder, hallucinations, memory difficulties, mood swings or suicidal  ideation Ophthalmic ROS: negative for - blurry vision, double vision, eye pain or loss of vision ENT ROS: negative for - epistaxis, nasal discharge, oral lesions, sore throat, tinnitus or vertigo Allergy and Immunology ROS: negative for - hives or itchy/watery eyes Hematological and Lymphatic ROS: negative for - bleeding problems, bruising or swollen lymph nodes Endocrine ROS: negative for - galactorrhea, hair pattern changes, polydipsia/polyuria or temperature intolerance Respiratory ROS: negative for - cough, hemoptysis, shortness of breath or wheezing Cardiovascular ROS: negative for - chest pain, dyspnea on exertion, edema or irregular heartbeat Gastrointestinal ROS: negative for - abdominal pain, diarrhea, hematemesis, nausea/vomiting or stool incontinence Genito-Urinary ROS: negative for - dysuria, hematuria, incontinence or urinary frequency/urgency Musculoskeletal ROS: negative for - joint swelling or muscular weakness Neurological ROS: as noted in HPI Dermatological ROS: negative for rash and skin lesion changes  Blood pressure 143/94, pulse 67, temperature 97.4 F (36.3 C), temperature source Oral, resp. rate 13, SpO2 99 %.  Neurologic Examination:                                                                                                      HEENT-  Normocephalic, no lesions, without obvious abnormality.  Normal external eye and conjunctiva.   Lungs- No gross wheezes.  Abdomen- Nondistended.  Extremities- No edema.   Neurological Examination Mental Status: Alert, oriented, thought content appropriate.  Tearful. Appears anxious. Speech fluent without evidence of aphasia.  Able to follow all commands without difficulty. Cranial Nerves: II: Visual fields normal, PERRL III,IV, VI: ptosis not present, extra-ocular motions intact bilaterally V,VII: smile symmetric, subjective decreased temperature and FT sensation to left side of face.  VIII: hearing intact to  conversation IX,X: No hypophonia noted.  XI: Symmetric XII: midline tongue extension Motor: Right : Upper extremity   5/5    Left:     Upper extremity   4+5  Lower extremity   5/5     Lower extremity   4+/5 Tone and bulk:normal tone throughout; no atrophy noted Sensory: Decreased PP and FT to LUE and LLE.  Deep Tendon Reflexes: 2+ and symmetric throughout Plantars: Right: downgoing   Left: downgoing Cerebellar: No ataxia on FNF bilaterally.  Gait: Deferred.   NIHSS = 3.    Lab Results: Basic Metabolic Panel:  Recent Labs Lab 07/11/15 1633 07/11/15 1648  NA 135 138  K 3.8 4.0  CL 102 99*  CO2 28  --   GLUCOSE 111* 107*  BUN 13 15  CREATININE 1.07 1.00  CALCIUM 9.2  --     Liver Function Tests:  Recent Labs Lab 07/11/15 1633  AST 22  ALT 23  ALKPHOS 51  BILITOT 0.7  PROT 6.7  ALBUMIN 3.9   No results for input(s): LIPASE, AMYLASE in the last 168 hours. No results for input(s): AMMONIA in the last 168 hours.  CBC:  Recent Labs  Lab 07/11/15 1633 07/11/15 1648  WBC 6.0  --   NEUTROABS 3.4  --   HGB 13.4 14.3  HCT 40.6 42.0  MCV 86.8  --   PLT 253  --     Cardiac Enzymes: No results for input(s): CKTOTAL, CKMB, CKMBINDEX, TROPONINI in the last 168 hours.  Lipid Panel: No results for input(s): CHOL, TRIG, HDL, CHOLHDL, VLDL, LDLCALC in the last 168 hours.  CBG:  Recent Labs Lab 07/11/15 1640  GLUCAP 112*    Microbiology: No results found for this or any previous visit.  Coagulation Studies:  Recent Labs  07/11/15 1633  LABPROT 14.4  INR 1.11    Imaging: Ct Angio Neck W/cm &/or Wo/cm  07/11/2015  CLINICAL DATA:  Left arm weakness.  Code stroke. EXAM: CT ANGIOGRAPHY NECK TECHNIQUE: Multidetector CT imaging of the neck was performed using the standard protocol during bolus administration of intravenous contrast. Multiplanar CT image reconstructions and MIPs were obtained to evaluate the vascular anatomy. Carotid stenosis measurements  (when applicable) are obtained utilizing NASCET criteria, using the distal internal carotid diameter as the denominator. CONTRAST:  75 mL Isovue 370 IV COMPARISON:  CT head and MRI head 07/11/2015 FINDINGS: Aortic arch: Normal aortic arch.  Proximal great vessels are normal. Right carotid system: Normal right carotid. Negative for atherosclerotic disease or dissection. Tortuosity of the cervical internal carotid. Left carotid system: Normal left carotid. Negative for atherosclerotic disease or dissection. Tortuosity of the internal carotid artery. Vertebral arteries:Normal vertebral artery bilaterally without stenosis or dissection. Skeleton: Negative Other neck: No mass or adenopathy in the neck. Paranasal sinuses clear. IMPRESSION: Negative CTA neck. Electronically Signed   By: Franchot Gallo M.D.   On: 07/11/2015 18:59   Mr Jodene Nam Head Wo Contrast  07/11/2015  CLINICAL DATA:  Stroke-like symptoms.  Left arm numbness EXAM: MRI HEAD WITHOUT CONTRAST MRA HEAD WITHOUT CONTRAST TECHNIQUE: Multiplanar, multiecho pulse sequences of the brain and surrounding structures were obtained without intravenous contrast. Angiographic images of the head were obtained using MRA technique without contrast. COMPARISON:  CT 07/11/2015 FINDINGS: MRI HEAD FINDINGS Imaging limited to diffusion weighted imaging only. Negative for acute infarct. Normal ventricle size. Normal diffusion-weighted imaging. MRA HEAD FINDINGS Both vertebral arteries appear normal. PICA, superior cerebellar, posterior cerebral arteries appear normal. Basilar is normal Internal carotid artery is normal bilaterally. Anterior and middle cerebral arteries are normal bilaterally Negative for cerebral aneurysm. IMPRESSION: Negative for acute infarct.  Limited imaging of the brain. Normal MRA circle of Willis. Electronically Signed   By: Franchot Gallo M.D.   On: 07/11/2015 18:11   Mr Brain Ltd W/o Cm  07/11/2015  CLINICAL DATA:  Stroke-like symptoms.  Left arm  numbness EXAM: MRI HEAD WITHOUT CONTRAST MRA HEAD WITHOUT CONTRAST TECHNIQUE: Multiplanar, multiecho pulse sequences of the brain and surrounding structures were obtained without intravenous contrast. Angiographic images of the head were obtained using MRA technique without contrast. COMPARISON:  CT 07/11/2015 FINDINGS: MRI HEAD FINDINGS Imaging limited to diffusion weighted imaging only. Negative for acute infarct. Normal ventricle size. Normal diffusion-weighted imaging. MRA HEAD FINDINGS Both vertebral arteries appear normal. PICA, superior cerebellar, posterior cerebral arteries appear normal. Basilar is normal Internal carotid artery is normal bilaterally. Anterior and middle cerebral arteries are normal bilaterally Negative for cerebral aneurysm. IMPRESSION: Negative for acute infarct.  Limited imaging of the brain. Normal MRA circle of Willis. Electronically Signed   By: Franchot Gallo M.D.   On: 07/11/2015 18:11   Ct Head Code Stroke Wo Contrast  07/11/2015  CLINICAL DATA:  Code stroke, dizziness and RIGHT arm weakness onset at 1500 hours today, history hypertension EXAM: CT HEAD WITHOUT CONTRAST TECHNIQUE: Contiguous axial images were obtained from the base of the skull through the vertex without intravenous contrast. COMPARISON:  None FINDINGS: Normal ventricular morphology. No midline shift or mass effect. Minimal small vessel chronic ischemic changes of deep cerebral white matter superiorly. Otherwise normal appearance of brain parenchyma. No intracranial hemorrhage, mass lesion or evidence acute infarction. No extra-axial fluid collections. Visualized paranasal sinuses and mastoid air cells clear. Bones unremarkable. IMPRESSION: Minimal small vessel chronic ischemic changes of deep cerebral white matter. No acute intracranial abnormalities. Findings called to Dr.  Tyrone Nine On 07/11/2015 at 1652 hours. Electronically Signed   By: Lavonia Dana M.D.   On: 07/11/2015 17:07   Impression: 1. Acute onset of  left sided sensory numbness/paresthesia, with mild left sided weakness. While still experiencing symptoms, his MRI brain revealed no DWI abnormality. MRA of head revealed no proximal/distal occlusion or stenosis. CTA of neck was normal. Given negative imaging findings and low NIHSS score of 3, IV tPA risks were felt to significantly outweigh potential benefits, and therefore administration was not indicated. Discussed with patient and his wife, who expressed understanding and agreement with the plan.  2. DDx for his weakness includes complicated migraine (first episode), conversion disorder and anxiety-related symptoms.     Recommendations: 1. Following imaging studies, discussed overnight observation as an option with the ED staff.  2. The patient has been discharged from the ED. If symptoms recur, he should be reevaluated.   Electronically signed: Dr. Kerney Elbe 07/11/2015, 10:28 PM

## 2015-09-27 DIAGNOSIS — Z8774 Personal history of (corrected) congenital malformations of heart and circulatory system: Secondary | ICD-10-CM

## 2015-09-27 HISTORY — DX: Personal history of (corrected) congenital malformations of heart and circulatory system: Z87.74

## 2016-11-09 ENCOUNTER — Inpatient Hospital Stay: Admit: 2016-11-09 | Discharge: 2016-11-09 | Disposition: A | Discharge status: Home / Self Care | Payer: Self-pay

## 2016-11-22 IMAGING — MR MR HEAD W/O CM
5 series · 39 of 48 positions shown · non-contrast
Comparison: CT 07/11/2015

CLINICAL DATA: Stroke-like symptoms.  Left arm numbness

EXAM:
MRI HEAD WITHOUT CONTRAST
MRA HEAD WITHOUT CONTRAST
TECHNIQUE: Multiplanar, multiecho pulse sequences of the brain and surrounding
structures were obtained without intravenous contrast. Angiographic
images of the head were obtained using MRA technique without
contrast.

[Series 3: DWI · axial · 3.0mm · 0.94mm/px · z∈[-58,+88]mm · 11 of 100 slices shown (1 of 2)]
[im 1/100]
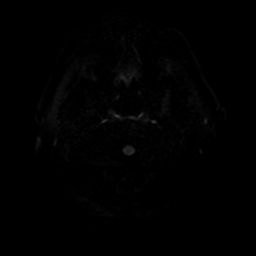
[im 10/100]
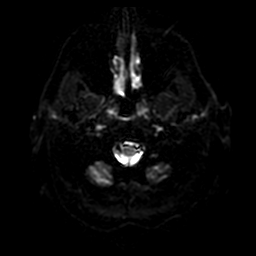
[im 20/100]
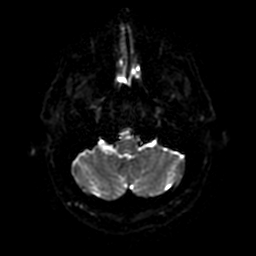
[im 30/100]
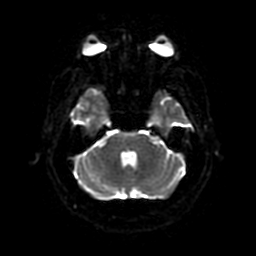
[im 40/100]
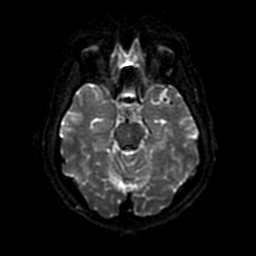
[im 50/100]
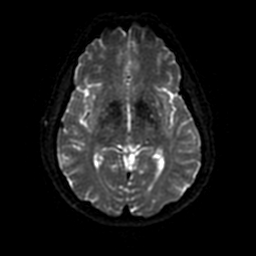
[im 60/100]
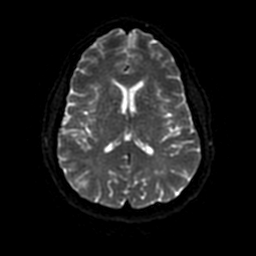
[im 70/100]
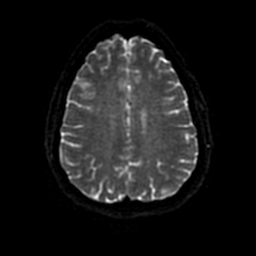
[im 80/100]
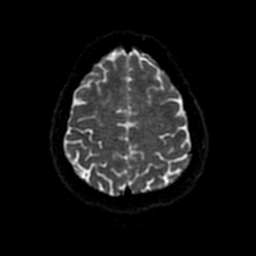
[im 90/100]
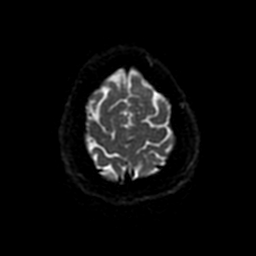
[im 100/100]
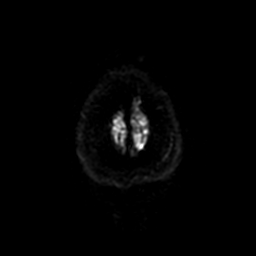

[Series 4: DWI · coronal · 4.0mm · 0.94mm/px · 8 of 70 slices shown (2 of 2)]
[im 1/70]
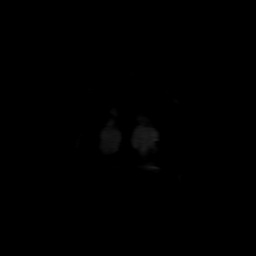
[im 10/70]
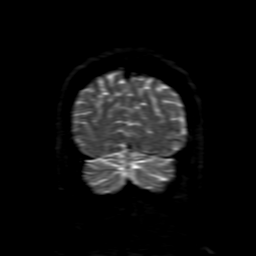
[im 20/70]
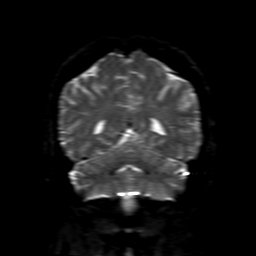
[im 30/70]
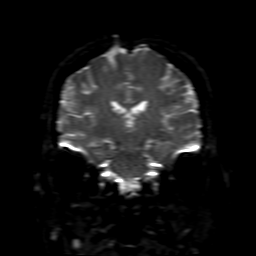
[im 40/70]
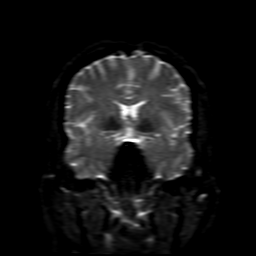
[im 50/70]
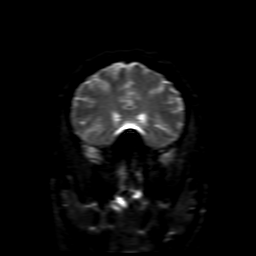
[im 60/70]
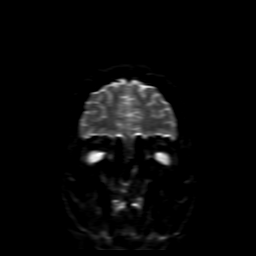
[im 70/70]
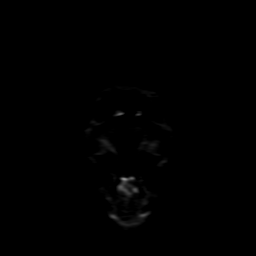

[Series 6: ax (id) 2 · axial · 1.0mm · 0.43mm/px · z∈[-75,+11]mm · 11 of 184 slices shown]
[im 1/184]
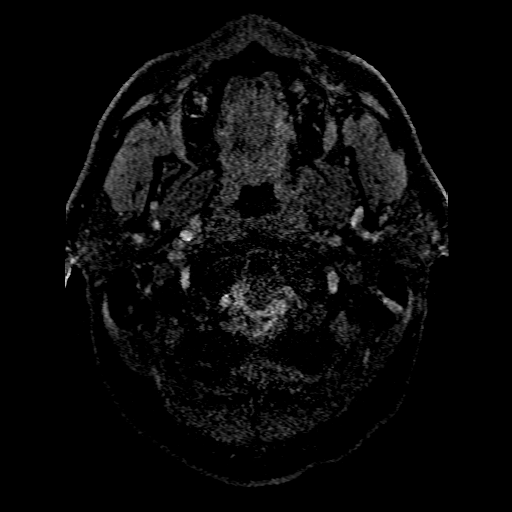
[im 10/184]
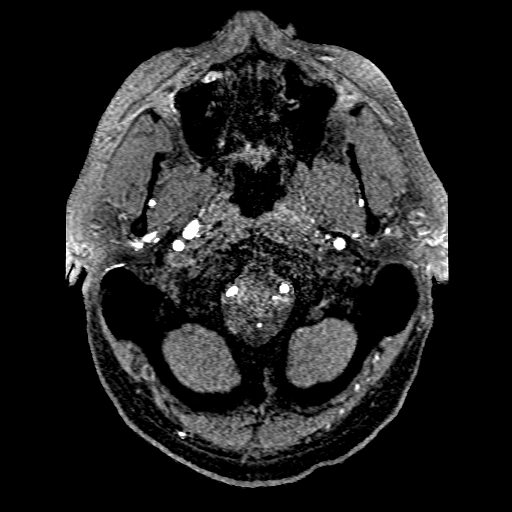
[im 20/184]
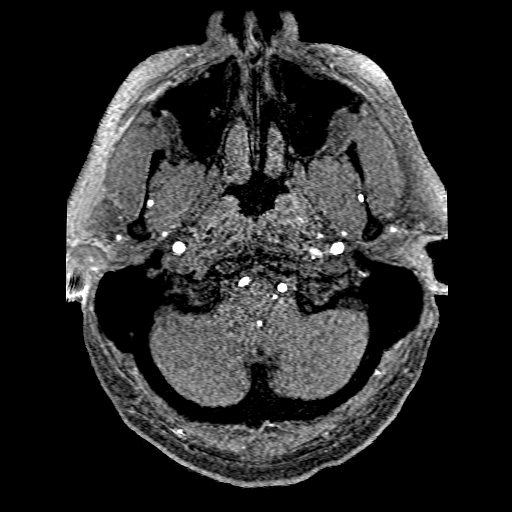
[im 29/184]
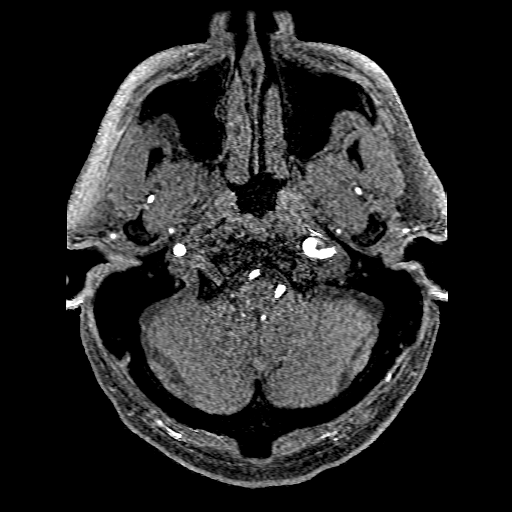
[im 39/184]
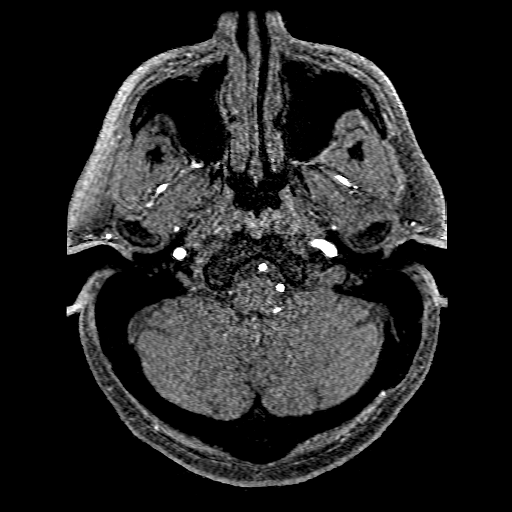
[im 58/184]
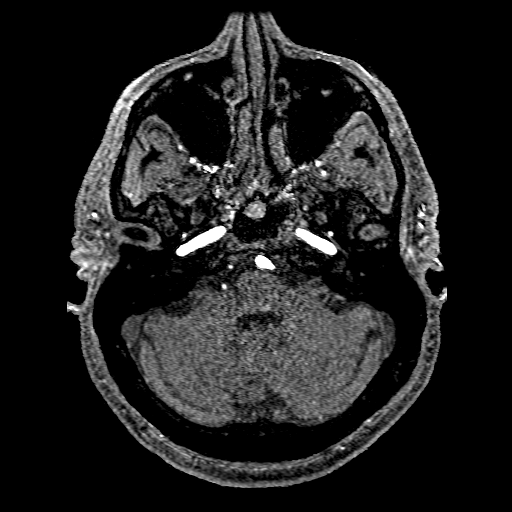
[im 78/184]
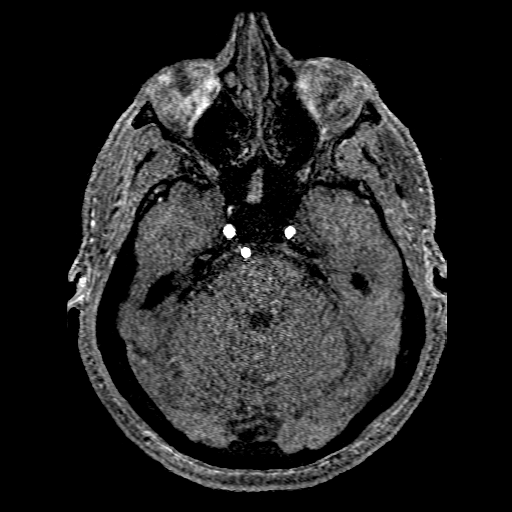
[im 106/184]
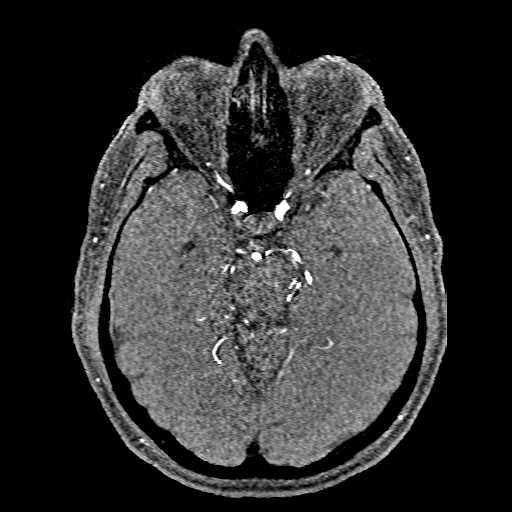
[im 126/184]
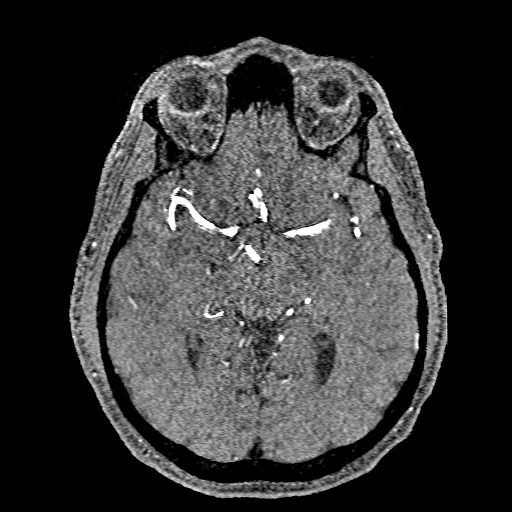
[im 155/184]
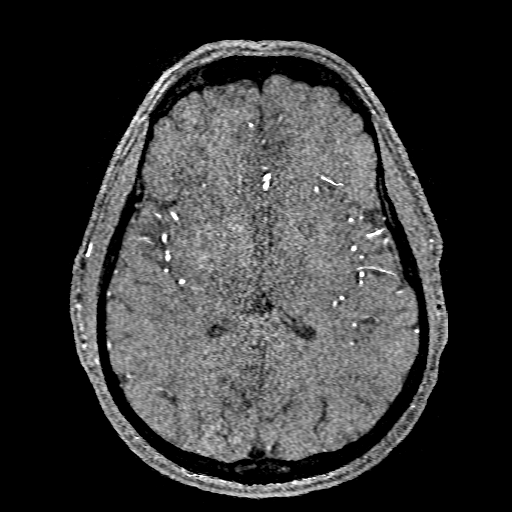
[im 174/184]
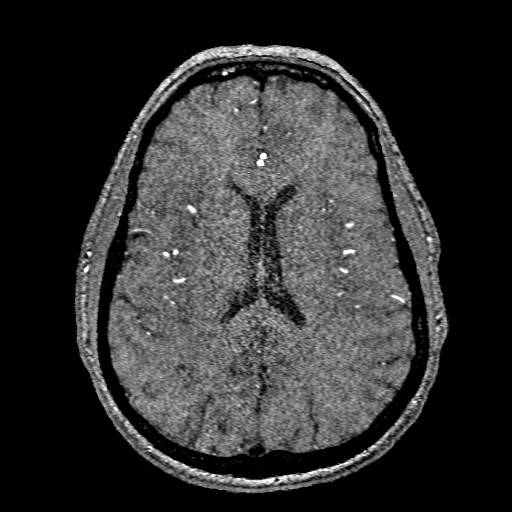

[Series 350: ADC · axial · 3.0mm · 0.94mm/px · z∈[-58,+88]mm · 5 of 50 slices shown (1 of 2)]
[im 1/50]
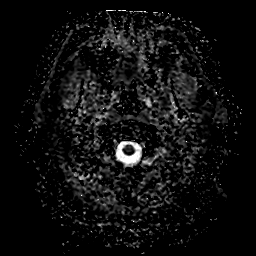
[im 13/50]
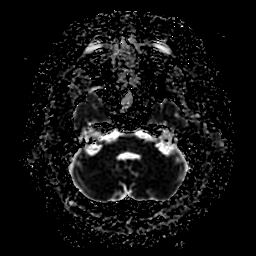
[im 25/50]
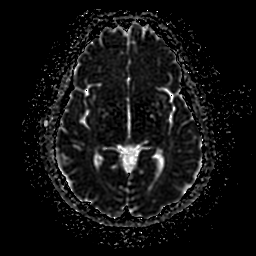
[im 37/50]
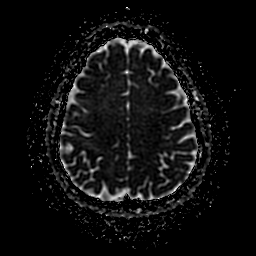
[im 50/50]
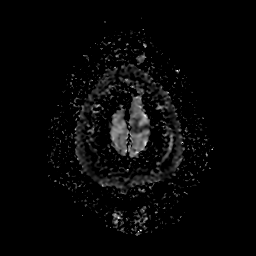

[Series 450: ADC · coronal · 4.0mm · 0.94mm/px · 4 of 35 slices shown (2 of 2)]
[im 1/35]
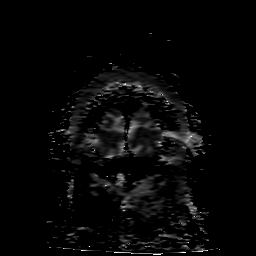
[im 12/35]
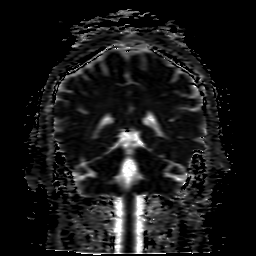
[im 23/35]
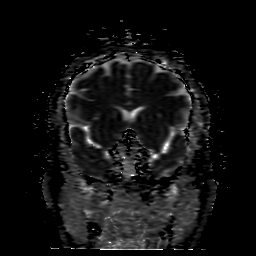
[im 35/35]
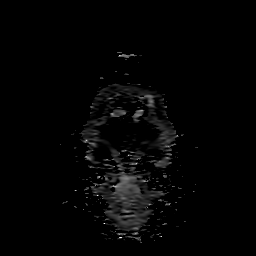

[39 of 48 positions shown; findings below may reference images not displayed]

FINDINGS: MRI HEAD FINDINGS

Imaging limited to diffusion weighted imaging only.

Negative for acute infarct. Normal ventricle size. Normal
diffusion-weighted imaging.

MRA HEAD FINDINGS

Both vertebral arteries appear normal. PICA, superior cerebellar,
posterior cerebral arteries appear normal. Basilar is normal

Internal carotid artery is normal bilaterally. Anterior and middle
cerebral arteries are normal bilaterally

Negative for cerebral aneurysm.
IMPRESSION: Negative for acute infarct.  Limited imaging of the brain.

Normal MRA circle of Willis.

## 2018-08-14 ENCOUNTER — Inpatient Hospital Stay: Admit: 2018-08-14 | Discharge: 2018-08-14 | Disposition: A | Discharge status: Home / Self Care | Payer: Self-pay

## 2018-12-19 ENCOUNTER — Inpatient Hospital Stay: Admit: 2018-12-19 | Discharge: 2018-12-19 | Disposition: A | Discharge status: Home / Self Care | Payer: Self-pay

## 2019-02-09 MED ORDER — PRAVASTATIN 40 MG TABLET
40 | ORAL_TABLET | Freq: Every evening | ORAL | 4 refills | 90.00000 days | Status: AC
Start: 2019-02-09 — End: 2020-05-15

## 2019-02-24 MED ORDER — VALSARTAN 160 MG TABLET
160 | ORAL_TABLET | ORAL | 4 refills | 90.00000 days | Status: AC
Start: 2019-02-24 — End: 2020-04-22

## 2019-03-24 MED ORDER — BUTALBITAL-ACETAMINOPHEN-CAFFEINE 50 MG-325 MG-40 MG TABLET
50-325-40 | ORAL | 1.00 refills | 5.00000 days | Status: AC
Start: 2019-03-24 — End: 2019-03-24

## 2019-04-22 ENCOUNTER — Ambulatory Visit: Admit: 2019-04-22 | Payer: BLUE CROSS/BLUE SHIELD

## 2019-04-23 DIAGNOSIS — Z23 Encounter for immunization: Secondary | ICD-10-CM

## 2019-05-13 ENCOUNTER — Ambulatory Visit: Admit: 2019-05-13 | Payer: PRIVATE HEALTH INSURANCE

## 2019-05-14 DIAGNOSIS — Z23 Encounter for immunization: Secondary | ICD-10-CM

## 2019-05-30 ENCOUNTER — Encounter: Admit: 2019-05-30 | Payer: PRIVATE HEALTH INSURANCE | Attending: Urology

## 2019-05-31 ENCOUNTER — Encounter: Admit: 2019-05-31 | Payer: PRIVATE HEALTH INSURANCE | Attending: Adult Health

## 2019-05-31 DIAGNOSIS — Z01818 Encounter for other preprocedural examination: Secondary | ICD-10-CM

## 2019-06-06 ENCOUNTER — Encounter: Admit: 2019-06-06 | Payer: PRIVATE HEALTH INSURANCE | Attending: Anesthesiology

## 2019-06-06 DIAGNOSIS — C499 Malignant neoplasm of connective and soft tissue, unspecified: Secondary | ICD-10-CM

## 2019-06-06 DIAGNOSIS — C801 Malignant (primary) neoplasm, unspecified: Secondary | ICD-10-CM

## 2019-06-06 DIAGNOSIS — I639 Cerebral infarction, unspecified: Secondary | ICD-10-CM

## 2019-06-06 DIAGNOSIS — I10 Essential (primary) hypertension: Secondary | ICD-10-CM

## 2019-06-11 ENCOUNTER — Ambulatory Visit: Admit: 2019-06-11 | Payer: BLUE CROSS/BLUE SHIELD

## 2019-06-12 ENCOUNTER — Inpatient Hospital Stay: Admit: 2019-06-12 | Discharge: 2019-06-12 | Payer: BLUE CROSS/BLUE SHIELD

## 2019-06-12 DIAGNOSIS — Z01818 Encounter for other preprocedural examination: Secondary | ICD-10-CM

## 2019-06-12 DIAGNOSIS — Z01812 Encounter for preprocedural laboratory examination: Secondary | ICD-10-CM

## 2019-06-12 DIAGNOSIS — Z20822 Contact with and (suspected) exposure to covid-19: Secondary | ICD-10-CM

## 2019-06-13 LAB — COVID-19 CLEARANCE OR FOR PLACEMENT ONLY: BKR SARS-COV-2 RNA (COVID-19) (YH): NEGATIVE

## 2019-06-14 ENCOUNTER — Inpatient Hospital Stay: Admit: 2019-06-14 | Discharge: 2019-06-14 | Payer: BLUE CROSS/BLUE SHIELD

## 2019-06-14 ENCOUNTER — Ambulatory Visit: Admit: 2019-06-14 | Payer: PRIVATE HEALTH INSURANCE

## 2019-06-14 ENCOUNTER — Encounter: Admit: 2019-06-14 | Payer: PRIVATE HEALTH INSURANCE | Attending: Adult Health

## 2019-06-14 ENCOUNTER — Encounter: Admit: 2019-06-14 | Payer: PRIVATE HEALTH INSURANCE | Attending: Anesthesiology

## 2019-06-14 ENCOUNTER — Inpatient Hospital Stay: Admit: 2019-06-14 | Discharge: 2019-06-14 | Disposition: A | Discharge status: Home / Self Care | Payer: Self-pay

## 2019-06-14 DIAGNOSIS — R0789 Other chest pain: Secondary | ICD-10-CM

## 2019-06-14 DIAGNOSIS — Z79899 Other long term (current) drug therapy: Secondary | ICD-10-CM

## 2019-06-14 DIAGNOSIS — I10 Essential (primary) hypertension: Secondary | ICD-10-CM

## 2019-06-14 DIAGNOSIS — Z01818 Encounter for other preprocedural examination: Secondary | ICD-10-CM

## 2019-06-14 DIAGNOSIS — E7800 Pure hypercholesterolemia, unspecified: Secondary | ICD-10-CM

## 2019-06-14 DIAGNOSIS — R1011 Right upper quadrant pain: Secondary | ICD-10-CM

## 2019-06-14 DIAGNOSIS — C801 Malignant (primary) neoplasm, unspecified: Secondary | ICD-10-CM

## 2019-06-14 DIAGNOSIS — C499 Malignant neoplasm of connective and soft tissue, unspecified: Secondary | ICD-10-CM

## 2019-06-14 DIAGNOSIS — G588 Other specified mononeuropathies: Secondary | ICD-10-CM

## 2019-06-14 DIAGNOSIS — I639 Cerebral infarction, unspecified: Secondary | ICD-10-CM

## 2019-06-14 MED ORDER — MIDAZOLAM (PF) 1 MG/ML INJECTION SOLUTION
1 mg/mL | Status: CP
Start: 2019-06-14 — End: ?

## 2019-06-14 MED ORDER — FENTANYL (PF) 50 MCG/ML INJECTION SOLUTION
50 mcg/mL | Status: CP
Start: 2019-06-14 — End: ?

## 2019-06-14 MED ORDER — SODIUM CHLORIDE 0.9 % (FLUSH) INJECTION SYRINGE
0.9 % | INTRAVENOUS | Status: DC | PRN
Start: 2019-06-14 — End: 2019-06-14
  Administered 2019-06-14: 11:00:00 0.9 mL via INTRAVENOUS

## 2019-06-14 MED ORDER — KETOCONAZOLE 2 % TOPICAL CREAM
2 % | Freq: Two times a day (BID) | TOPICAL | Status: AC | PRN
Start: 2019-06-14 — End: ?

## 2019-06-14 MED ORDER — SODIUM CHLORIDE 0.9 % (FLUSH) INJECTION SYRINGE
0.9 % | Freq: Three times a day (TID) | INTRAVENOUS | Status: DC
Start: 2019-06-14 — End: 2019-06-14

## 2019-06-14 MED ORDER — ONDANSETRON 8 MG DISINTEGRATING TABLET
8 mg | Status: CP
Start: 2019-06-14 — End: ?
  Administered 2019-06-14: 11:00:00 8 mg via ORAL

## 2019-06-14 MED ORDER — BUPIVACAINE (PF) 0.25 % (2.5 MG/ML) INJECTION SOLUTION
0.25 % (2.5 mg/mL) | Status: DC
Start: 2019-06-14 — End: 2019-06-14

## 2019-06-14 MED ORDER — FENTANYL (PF) 50 MCG/ML INJECTION SOLUTION
50 mcg/mL | Status: DC | PRN
Start: 2019-06-14 — End: 2019-06-14
  Administered 2019-06-14 (×2): 50 mcg/mL

## 2019-06-14 MED ORDER — ONDANSETRON HCL 4 MG TABLET
4 mg | Freq: Once | ORAL | Status: DC
Start: 2019-06-14 — End: 2019-06-14

## 2019-06-14 MED ORDER — METHYLPREDNISOLONE ACETATE 40 MG/ML SUSPENSION FOR INJECTION
40 mg/mL | Status: DC | PRN
Start: 2019-06-14 — End: 2019-06-14
  Administered 2019-06-14: 12:00:00 40 mg/mL

## 2019-06-14 MED ORDER — ONDANSETRON 8 MG DISINTEGRATING TABLET
8 mg | Freq: Once | Status: DC
Start: 2019-06-14 — End: 2019-06-14

## 2019-06-14 MED ORDER — BUPIVACAINE (PF) 0.25 % (2.5 MG/ML) INJECTION SOLUTION
0.25 % (2.5 mg/mL) | Status: DC | PRN
Start: 2019-06-14 — End: 2019-06-14
  Administered 2019-06-14: 12:00:00 0.25 % (2.5 mg/mL)

## 2019-06-14 MED ORDER — TRIAMCINOLONE ACETONIDE 0.025 % TOPICAL CREAM
0.025 % | Freq: Two times a day (BID) | TOPICAL | Status: AC | PRN
Start: 2019-06-14 — End: ?

## 2019-06-14 MED ORDER — MIDAZOLAM (PF) 1 MG/ML INJECTION SOLUTION
1 mg/mL | Status: DC | PRN
Start: 2019-06-14 — End: 2019-06-14
  Administered 2019-06-14: 12:00:00 1 mg/mL via INTRAVENOUS

## 2019-06-14 MED ORDER — METHYLPREDNISOLONE ACETATE 40 MG/ML SUSPENSION FOR INJECTION
40 mg/mL | Status: DC
Start: 2019-06-14 — End: 2019-06-14

## 2019-06-14 NOTE — Discharge Instructions
[phone] to Expect:You may experience an increase in your usual pain and/or soreness at the injection site for the first few days following the procedure.  The relief from the injection may take 3-5 days for full effect.  Please note if a steroid was used, it may cause some facial flushing (redness) or puffiness as a side effect. If it occurs, it is temporary and will subside.  Other steroid side effects may include: water retention, headache, changes in menstrual flow, mood changes, sweats, insomnia, and restlessness.  These effects are usually mild and short in duration, rarely lasting beyond one or two weeks. This is NOT considered an allergic reaction.If you are diabetic, the steroids may temporarily increase your blood sugar levels.  If this occurs, please notify your Primary Care Physician.  Your diabetes medication may need to be adjusted.Medication:          Please resume all previously prescribed medications including blood thinners .        Remember to take medications as prescribed/ordered.For celiac plexus neurolysis, you may experience loose bowel movements for the first week after the procedure.  Please stay hydrated and continue a well-balanced diet.Your comfort is important to us. Please call if you developed any of the following problems:  Increased  soreness, redness, drainage or warmth where the injection was done, severe persistent headache, fever/chills, increased severe pain, new numbness/weakness, or change in level of consciousness or changes in bladder or bowel function or incontinence.  WEEKDAYS: Please call the Pain Center if you have any questions or concerns at [phone] during normal business hours 8:00 p.m. to 4:30 p.m.AFTER HOURS AND WEEKENDS: As always, if you are having a pain management emergency, please call the L&M  Operator at  [phone] and  call pain doctor  or ask that they page the anesthesiologist on call If for any reason, you are unable to reach a physician from the pain medicine center or the anesthesiologist on call and you are having any of the problems listed above, please go to the nearest emergency room and show them this document.Activity and Showering:Rest today and take it easy. Stay off your feet as much as possible today. No strenuous exercise, heavy lifting or labor-intensive activities. For injections, you may resume normal activities, as tolerated tomorrow, but avoid strenuous activity for a few days. For patients undergoing intrathecal pump or spinal cord stimulator implant, no strenuous activity, bending or extreme flexion until deemed appropriate after follow-up (minimum 2-4 weeks).For injections, you may remove the Band-Aid dressing and shower tonight or tomorrow. For injections, no submerging in water (bath tub, swimming, Jacuzzi, etc.) for 24 hours after the procedure.For patients undergoing intrathecal pump or spinal cord stimulator implant, you will be discharged home with an abdominal binder that should be continued until follow-up appointment.  Sponge bath only.  Maintain incision and dressing area clean, dry, and intact until follow-up appointment, when your healing progress will be evaluated.Medications and Diet:Use Tylenol (not aspirin) or your pain medication for discomfort at the injection site. Aspirin can cause bleeding. You may also use ice for soreness over the injection site (with at least a paper towel between the ice and your skin). Apply the ice for 30 minutes, then leave off for 30 minutes - repeat this cycle as necessary.You may resume taking any medications you stopped prior to the procedure.  You may continue to take your pain medications as prescribed.You may continue with your regular diet.For   patients undergoing intrathecal pump or spinal cord stimulator implant, you will be discharged home with a prescription for antibiotics for seven (7) days - please complete this course of antibiotics to minimize infection risk. Special instructions:
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
## 2019-06-14 NOTE — Other
[facility] HealthPatient Name: Philip [name]        [id number]      Surgery Date: 5/19/2021Surgeon(s) and Role:   * Awanda Mink, MD - PrimaryStaff:  Circulator: Mariel Craft, RNRadiology Technologist: Shawn Route Person: Woody Seller MSedation Nurse: Duanne Moron, RNPre-Op Diagnosis: Right upper quadrant pain [R10.11] right intercostal neuralgiaPost Operative Diagnosis:  As aboveProcedure(s):INTERCOSTAL NERVE BLOCK RIGHT, T12, T11, T10Fluoroscopic time:  42  Seconds saved in the patient's file.Indications:  59 years old gentleman pain in the right upper quadrant right side of the chest wall pain starts from the front goes in the intercostal area posteriorly as well as laterally has extensive abdominal workup including ultrasound  [place] scan.  Schedule for intercostal nerve block both for diagnostic and therapeutic purposes no new symptoms risk alternative discuss consent signedPhysical exam :   Grossly unchanged.Anesthesia; IV moderate sedation ; under my direct personal supervision, IV moderate  sedation was administered by a trained RN.  Patient received midazolam 2 milligram and fentanyl 100 mics.  An RN assisted in monitoring the patient's level of consciousness and physiological status throughout the procedure.  Total sedation time 22 minutes.Procedure: After explanation of the risks and benefits, consent was obtained. The patient was brought to the operating room and placed in the prone position  2 pillows under the belly.  Standard OR protocol was followed. The skin over the right intercostal posterior was prepped and draped in sterile fashion. Strict sterile precautions were used throughout. Lidocaine 1% was used for skin and subcutaneous infiltration. Procedure:  Right intercostal nerve block under fluoroscopy with strict sterile technique in the prone position T12, T11, T10 22 gauge spinal needle after touching the needle in the middle of the rib gently walked off inferiorly at each level the patient received 0.2% bupivacaine and 40 milligram Depo-Medrol, patient received 2 cubic centimeters of the above mixture at each level easily uneventfully no heme or paresthesia or air, easy atraumatic intercostal nerve blockTreatment plan:  2 to 3 weeks, referred to general surgeon if his pain continues  Duyen Beckom, MD5/19/20218:13 AM

## 2019-06-25 ENCOUNTER — Inpatient Hospital Stay: Admit: 2019-06-25 | Discharge: 2019-06-25 | Payer: BLUE CROSS/BLUE SHIELD

## 2019-06-25 DIAGNOSIS — Z20822 Contact with and (suspected) exposure to covid-19: Secondary | ICD-10-CM

## 2019-06-25 DIAGNOSIS — Z01818 Encounter for other preprocedural examination: Secondary | ICD-10-CM

## 2019-06-25 DIAGNOSIS — Z01812 Encounter for preprocedural laboratory examination: Secondary | ICD-10-CM

## 2019-06-25 LAB — COVID-19 CLEARANCE OR FOR PLACEMENT ONLY: BKR SARS-COV-2 RNA (COVID-19) (YH): NEGATIVE

## 2019-06-28 ENCOUNTER — Encounter: Admit: 2019-06-28 | Payer: PRIVATE HEALTH INSURANCE | Attending: Anesthesiology

## 2019-06-28 ENCOUNTER — Inpatient Hospital Stay: Admit: 2019-06-28 | Discharge: 2019-06-28 | Payer: BLUE CROSS/BLUE SHIELD

## 2019-06-28 ENCOUNTER — Encounter: Admit: 2019-06-28 | Payer: PRIVATE HEALTH INSURANCE | Attending: Adult Health

## 2019-06-28 ENCOUNTER — Inpatient Hospital Stay: Admit: 2019-06-28 | Discharge: 2019-06-28 | Disposition: A | Discharge status: Home / Self Care | Payer: Self-pay

## 2019-06-28 DIAGNOSIS — I639 Cerebral infarction, unspecified: Secondary | ICD-10-CM

## 2019-06-28 DIAGNOSIS — R52 Pain, unspecified: Secondary | ICD-10-CM

## 2019-06-28 DIAGNOSIS — E7800 Pure hypercholesterolemia, unspecified: Secondary | ICD-10-CM

## 2019-06-28 DIAGNOSIS — Q211 Atrial septal defect: Secondary | ICD-10-CM

## 2019-06-28 DIAGNOSIS — Z01818 Encounter for other preprocedural examination: Secondary | ICD-10-CM

## 2019-06-28 DIAGNOSIS — R1011 Right upper quadrant pain: Secondary | ICD-10-CM

## 2019-06-28 DIAGNOSIS — I10 Essential (primary) hypertension: Secondary | ICD-10-CM

## 2019-06-28 DIAGNOSIS — C801 Malignant (primary) neoplasm, unspecified: Secondary | ICD-10-CM

## 2019-06-28 DIAGNOSIS — Z8589 Personal history of malignant neoplasm of other organs and systems: Secondary | ICD-10-CM

## 2019-06-28 DIAGNOSIS — C499 Malignant neoplasm of connective and soft tissue, unspecified: Secondary | ICD-10-CM

## 2019-06-28 DIAGNOSIS — Z8673 Personal history of transient ischemic attack (TIA), and cerebral infarction without residual deficits: Secondary | ICD-10-CM

## 2019-06-28 MED ORDER — ONDANSETRON 8 MG DISINTEGRATING TABLET
8 mg | Freq: Once | Status: CP
Start: 2019-06-28 — End: ?
  Administered 2019-06-28: 13:00:00 8 mg

## 2019-06-28 MED ORDER — SODIUM CHLORIDE 0.9 % (FLUSH) INJECTION SYRINGE
0.9 % | INTRAVENOUS | Status: DC | PRN
Start: 2019-06-28 — End: 2019-06-28
  Administered 2019-06-28: 13:00:00 0.9 mL via INTRAVENOUS

## 2019-06-28 MED ORDER — BUPIVACAINE (PF) 0.25 % (2.5 MG/ML) INJECTION SOLUTION
0.25 % (2.5 mg/mL) | Status: DC
Start: 2019-06-28 — End: 2019-06-28

## 2019-06-28 MED ORDER — FENTANYL (PF) 50 MCG/ML INJECTION SOLUTION
50 mcg/mL | Status: CP
Start: 2019-06-28 — End: ?

## 2019-06-28 MED ORDER — METHYLPREDNISOLONE ACETATE 40 MG/ML SUSPENSION FOR INJECTION
40 mg/mL | Status: DC
Start: 2019-06-28 — End: 2019-06-28

## 2019-06-28 MED ORDER — IBUPROFEN 400 MG TABLET
400 mg | Freq: Four times a day (QID) | ORAL | Status: AC | PRN
Start: 2019-06-28 — End: 2019-07-28

## 2019-06-28 MED ORDER — FENTANYL (PF) 50 MCG/ML INJECTION SOLUTION
50 mcg/mL | Status: DC | PRN
Start: 2019-06-28 — End: 2019-06-28
  Administered 2019-06-28: 13:00:00 50 mcg/mL

## 2019-06-28 MED ORDER — METHYLPREDNISOLONE ACETATE 40 MG/ML SUSPENSION FOR INJECTION
40 mg/mL | Status: DC | PRN
Start: 2019-06-28 — End: 2019-06-28
  Administered 2019-06-28: 13:00:00 40 mg/mL

## 2019-06-28 MED ORDER — MIDAZOLAM (PF) 1 MG/ML INJECTION SOLUTION
1 mg/mL | Status: CP
Start: 2019-06-28 — End: ?

## 2019-06-28 MED ORDER — BUPIVACAINE (PF) 0.25 % (2.5 MG/ML) INJECTION SOLUTION
0.25 % (2.5 mg/mL) | Status: DC | PRN
Start: 2019-06-28 — End: 2019-06-28
  Administered 2019-06-28: 13:00:00 0.25 % (2.5 mg/mL)

## 2019-06-28 MED ORDER — MIDAZOLAM (PF) 1 MG/ML INJECTION SOLUTION
1 mg/mL | Status: DC | PRN
Start: 2019-06-28 — End: 2019-06-28
  Administered 2019-06-28: 13:00:00 1 mg/mL via INTRAVENOUS

## 2019-06-28 NOTE — Discharge Instructions
[phone] to Expect:You may experience an increase in your usual pain and/or soreness at the injection site for the first few days following the procedure.  The relief from the injection may take 3-5 days for full effect.  Please note if a steroid was used, it may cause some facial flushing (redness) or puffiness as a side effect. If it occurs, it is temporary and will subside.  Other steroid side effects may include: water retention, headache, changes in menstrual flow, mood changes, sweats, insomnia, and restlessness.  These effects are usually mild and short in duration, rarely lasting beyond one or two weeks. This is NOT considered an allergic reaction.If you are diabetic, the steroids may temporarily increase your blood sugar levels.  If this occurs, please notify your Primary Care Physician.  Your diabetes medication may need to be adjusted.Medication:          Please resume all previously prescribed medications including blood thinners .        Remember to take medications as prescribed/ordered.For celiac plexus neurolysis, you may experience loose bowel movements for the first week after the procedure.  Please stay hydrated and continue a well-balanced diet.Your comfort is important to us. Please call if you developed any of the following problems:  Increased  soreness, redness, drainage or warmth where the injection was done, severe persistent headache, fever/chills, increased severe pain, new numbness/weakness, or change in level of consciousness or changes in bladder or bowel function or incontinence.  WEEKDAYS: Please call the Pain Center if you have any questions or concerns at [phone] during normal business hours 8:00 p.m. to 4:30 p.m.AFTER HOURS AND WEEKENDS: As always, if you are having a pain management emergency, please call the L&M  Operator at  [phone] and  call pain doctor  or ask that they page the anesthesiologist on call If for any reason, you are unable to reach a physician from the pain medicine center or the anesthesiologist on call and you are having any of the problems listed above, please go to the nearest emergency room and show them this document.Activity and Showering:Rest today and take it easy. Stay off your feet as much as possible today. No strenuous exercise, heavy lifting or labor-intensive activities. For injections, you may resume normal activities, as tolerated tomorrow, but avoid strenuous activity for a few days. For patients undergoing intrathecal pump or spinal cord stimulator implant, no strenuous activity, bending or extreme flexion until deemed appropriate after follow-up (minimum 2-4 weeks).For injections, you may remove the Band-Aid dressing and shower tonight or tomorrow. For injections, no submerging in water (bath tub, swimming, Jacuzzi, etc.) for 24 hours after the procedure.For patients undergoing intrathecal pump or spinal cord stimulator implant, you will be discharged home with an abdominal binder that should be continued until follow-up appointment.  Sponge bath only.  Maintain incision and dressing area clean, dry, and intact until follow-up appointment, when your healing progress will be evaluated.Medications and Diet:Use Tylenol (not aspirin) or your pain medication for discomfort at the injection site. Aspirin can cause bleeding. You may also use ice for soreness over the injection site (with at least a paper towel between the ice and your skin). Apply the ice for 30 minutes, then leave off for 30 minutes - repeat this cycle as necessary.You may resume taking any medications you stopped prior to the procedure.  You may continue to take your pain medications as prescribed.You may continue with your regular diet.For   patients undergoing intrathecal pump or spinal cord stimulator implant, you will be discharged home with a prescription for antibiotics for seven (7) days - please complete this course of antibiotics to minimize infection risk. Special instructions:
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
## 2019-06-28 NOTE — Other
[facility] HealthPatient Name: Philip [name]        [id number]      Surgery Date: 6/2/2021Surgeon(s) and Role:   * Awanda Mink, MD - PrimaryStaff:  Circulator: Cristino Martes, RNScrub Person: Woody Seller MSedation Nurse: Duanne Moron, RNPre-Op Diagnosis: Right upper quadrant pain [R10.11], right intercostal neuralgiaPost Operative Diagnosis:  As aboveProcedure(s):Right Intercostal Nerve Block #2  , T12, T11, T10Fluoroscopic time:  34  Seconds saved in the patient's file.Indications: 59 years old gentleman pain in the right upper quadrant right side of the chest wall pain starts from the front goes in the intercostal area posteriorly as well as laterally has extensive abdominal workup including ultrasound  [place] scan.  Schedule for intercostal nerve block both for diagnostic and therapeutic purposes no new symptoms risk alternative discuss consent signed.  First intercostal nerve block gave him some benefit wants to proceed 2. If no further benefit we will hold doing the intercostal nerve block risk alternative discuss again?Physical exam :   Grossly unchanged.Anesthesia; IV moderate sedation ; under my direct personal supervision, IV moderate  sedation was administered by a trained RN.  Patient received midazolam 2 milligram and fentanyl 50 mics.  An RN assisted in monitoring the patient's level of consciousness and physiological status throughout the procedure.  Total sedation time 21 minutes.Procedure: After explanation of the risks and benefits, consent was obtained. The patient was brought to the operating room and placed in the prone position  2 pillows under the legs.  Standard OR protocol was followed. The skin over the right intercostal was prepped and draped in sterile fashion. Strict sterile precautions were used throughout. Lidocaine 1% was used for skin and subcutaneous infiltration. Procedure:  Right intercostal under fluoroscopy with strict sterile technique T12, T11, T10  22 gauge needle was introduced touching the rib in the middle gently gently walked off inferior at each level, then the patient received 0.25% bupivacaine and 40 milligram Depo-Medrol , patient received 2 cubic centimeters at each level no heme or paresthesia easy atraumatic intercostal nerve blockTreatment plan:  2 to 3 weeks  Ladrea Holladay, MD6/2/20219:17 AM

## 2019-07-09 ENCOUNTER — Inpatient Hospital Stay: Admit: 2019-07-09 | Discharge: 2019-07-09 | Payer: BLUE CROSS/BLUE SHIELD

## 2019-07-09 DIAGNOSIS — Z01812 Encounter for preprocedural laboratory examination: Secondary | ICD-10-CM

## 2019-07-09 DIAGNOSIS — Z01818 Encounter for other preprocedural examination: Secondary | ICD-10-CM

## 2019-07-09 DIAGNOSIS — Z20822 Contact with and (suspected) exposure to covid-19: Secondary | ICD-10-CM

## 2019-07-09 LAB — COVID-19 CLEARANCE OR FOR PLACEMENT ONLY: BKR SARS-COV-2 RNA (COVID-19) (YH): NEGATIVE

## 2019-07-12 ENCOUNTER — Inpatient Hospital Stay: Admit: 2019-07-12 | Discharge: 2019-07-12 | Payer: BLUE CROSS/BLUE SHIELD

## 2019-07-12 ENCOUNTER — Encounter: Admit: 2019-07-12 | Payer: PRIVATE HEALTH INSURANCE | Attending: Anesthesiology

## 2019-07-12 DIAGNOSIS — E7800 Pure hypercholesterolemia, unspecified: Secondary | ICD-10-CM

## 2019-07-12 DIAGNOSIS — I10 Essential (primary) hypertension: Secondary | ICD-10-CM

## 2019-07-12 DIAGNOSIS — R52 Pain, unspecified: Secondary | ICD-10-CM

## 2019-07-12 DIAGNOSIS — C801 Malignant (primary) neoplasm, unspecified: Secondary | ICD-10-CM

## 2019-07-12 DIAGNOSIS — C499 Malignant neoplasm of connective and soft tissue, unspecified: Secondary | ICD-10-CM

## 2019-07-12 DIAGNOSIS — R1011 Right upper quadrant pain: Secondary | ICD-10-CM

## 2019-07-12 DIAGNOSIS — Z8673 Personal history of transient ischemic attack (TIA), and cerebral infarction without residual deficits: Secondary | ICD-10-CM

## 2019-07-12 DIAGNOSIS — Z79899 Other long term (current) drug therapy: Secondary | ICD-10-CM

## 2019-07-12 DIAGNOSIS — I639 Cerebral infarction, unspecified: Secondary | ICD-10-CM

## 2019-07-12 MED ORDER — BUPIVACAINE (PF) 0.25 % (2.5 MG/ML) INJECTION SOLUTION
0.25 % (2.5 mg/mL) | Status: DC
Start: 2019-07-12 — End: 2019-07-12

## 2019-07-12 MED ORDER — BUPIVACAINE (PF) 0.25 % (2.5 MG/ML) INJECTION SOLUTION
0.25 % (2.5 mg/mL) | Status: DC | PRN
Start: 2019-07-12 — End: 2019-07-12
  Administered 2019-07-12: 13:00:00 0.25 % (2.5 mg/mL)

## 2019-07-12 MED ORDER — FENTANYL (PF) 50 MCG/ML INJECTION SOLUTION
50 mcg/mL | Status: DC | PRN
Start: 2019-07-12 — End: 2019-07-12
  Administered 2019-07-12 (×2): 50 mcg/mL

## 2019-07-12 MED ORDER — MIDAZOLAM (PF) 1 MG/ML INJECTION SOLUTION
1 mg/mL | Status: DC | PRN
Start: 2019-07-12 — End: 2019-07-12
  Administered 2019-07-12: 13:00:00 1 mg/mL via INTRAVENOUS

## 2019-07-12 MED ORDER — SODIUM CHLORIDE 0.9 % (FLUSH) INJECTION SYRINGE
0.9 % | Freq: Three times a day (TID) | INTRAVENOUS | Status: DC
Start: 2019-07-12 — End: 2019-07-12
  Administered 2019-07-12: 13:00:00 0.9 mL via INTRAVENOUS

## 2019-07-12 MED ORDER — METHYLPREDNISOLONE ACETATE 40 MG/ML SUSPENSION FOR INJECTION
40 mg/mL | Status: DC | PRN
Start: 2019-07-12 — End: 2019-07-12
  Administered 2019-07-12: 13:00:00 40 mg/mL

## 2019-07-12 MED ORDER — FENTANYL (PF) 50 MCG/ML INJECTION SOLUTION
50 mcg/mL | Status: CP
Start: 2019-07-12 — End: ?

## 2019-07-12 MED ORDER — MIDAZOLAM (PF) 1 MG/ML INJECTION SOLUTION
1 mg/mL | Status: CP
Start: 2019-07-12 — End: ?

## 2019-07-12 MED ORDER — ONDANSETRON 8 MG DISINTEGRATING TABLET
8 mg | Freq: Once | Status: CP
Start: 2019-07-12 — End: ?
  Administered 2019-07-12: 13:00:00 8 mg

## 2019-07-12 MED ORDER — METHYLPREDNISOLONE ACETATE 40 MG/ML SUSPENSION FOR INJECTION
40 mg/mL | Status: DC
Start: 2019-07-12 — End: 2019-07-12
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
## 2019-07-12 NOTE — Discharge Instructions
[phone] to Expect:You may experience an increase in your usual pain and/or soreness at the injection site for the first few days following the procedure.  The relief from the injection may take 3-5 days for full effect.  Please note if a steroid was used, it may cause some facial flushing (redness) or puffiness as a side effect. If it occurs, it is temporary and will subside.  Other steroid side effects may include: water retention, headache, changes in menstrual flow, mood changes, sweats, insomnia, and restlessness.  These effects are usually mild and short in duration, rarely lasting beyond one or two weeks. This is NOT considered an allergic reaction.If you are diabetic, the steroids may temporarily increase your blood sugar levels.  If this occurs, please notify your Primary Care Physician.  Your diabetes medication may need to be adjusted.Medication:          Please resume all previously prescribed medications including blood thinners .        Remember to take medications as prescribed/ordered.For celiac plexus neurolysis, you may experience loose bowel movements for the first week after the procedure.  Please stay hydrated and continue a well-balanced diet.Your comfort is important to us. Please call if you developed any of the following problems:  Increased  soreness, redness, drainage or warmth where the injection was done, severe persistent headache, fever/chills, increased severe pain, new numbness/weakness, or change in level of consciousness or changes in bladder or bowel function or incontinence.  WEEKDAYS: Please call the Pain Center if you have any questions or concerns at [phone] during normal business hours 8:00 p.m. to 4:30 p.m.AFTER HOURS AND WEEKENDS: As always, if you are having a pain management emergency, please call the L&M  Operator at  [phone] and  call pain doctor  or ask that they page the anesthesiologist on call If for any reason, you are unable to reach a physician from the pain medicine center or the anesthesiologist on call and you are having any of the problems listed above, please go to the nearest emergency room and show them this document.Activity and Showering:Rest today and take it easy. Stay off your feet as much as possible today. No strenuous exercise, heavy lifting or labor-intensive activities. For injections, you may resume normal activities, as tolerated tomorrow, but avoid strenuous activity for a few days. For patients undergoing intrathecal pump or spinal cord stimulator implant, no strenuous activity, bending or extreme flexion until deemed appropriate after follow-up (minimum 2-4 weeks).For injections, you may remove the Band-Aid dressing and shower tonight or tomorrow. For injections, no submerging in water (bath tub, swimming, Jacuzzi, etc.) for 24 hours after the procedure.For patients undergoing intrathecal pump or spinal cord stimulator implant, you will be discharged home with an abdominal binder that should be continued until follow-up appointment.  Sponge bath only.  Maintain incision and dressing area clean, dry, and intact until follow-up appointment, when your healing progress will be evaluated.Medications and Diet:Use Tylenol (not aspirin) or your pain medication for discomfort at the injection site. Aspirin can cause bleeding. You may also use ice for soreness over the injection site (with at least a paper towel between the ice and your skin). Apply the ice for 30 minutes, then leave off for 30 minutes - repeat this cycle as necessary.You may resume taking any medications you stopped prior to the procedure.  You may continue to take your pain medications as prescribed.You may continue with your regular diet.For   patients undergoing intrathecal pump or spinal cord stimulator implant, you will be discharged home with a prescription for antibiotics for seven (7) days - please complete this course of antibiotics to minimize infection risk. Special instructions:

## 2019-07-12 NOTE — Other
[place] [facility] HealthPatient Name: Philip [name]        [id number]      Surgery Date: 6/16/2021Surgeon(s) and Role:   * Awanda Mink, MD - PrimaryStaff:  Circulator: Mariel Craft, RNRadiology Technologist: Jonetta Osgood, AnthonyScrub Person: Woody Seller MSedation Nurse: Ronney Lion, RNPre-Op Diagnosis: Right upper quadrant pain [R10.11] right intercostal neuralgiaPost Operative Diagnosis: * as aboveProcedure(s):INTERCOSTAL NERVE BLOCK RIGHT T12, T11, T10Fluoroscopic time:  32  Seconds saved in the patient's file.Indications:?59 years old gentleman pain in the right upper quadrant right side of the chest wall pain starts from the front goes in the intercostal area posteriorly as well as laterally has extensive abdominal workup including ultrasound ?[place] scan. ?Schedule for intercostal nerve block both for diagnostic and therapeutic purposes no new symptoms risk alternative discuss consent signed.   two intercostal nerve block gave him good benefit particularly 2nd one  wants to proceed 3. If no further benefit we will hold doing the intercostal nerve block risk alternative discuss againPhysical exam :   Grossly unchanged.Anesthesia; IV moderate sedation ; under my direct personal supervision, IV moderate  sedation was administered by a trained RN.  Patient received midazolam 2 milligram and fentanyl 100 mics.  An RN assisted in monitoring the patient's level of consciousness and physiological status throughout the procedure.  Total sedation time 21 minutes.Procedure: After explanation of the risks and benefits, consent was obtained. The patient was brought to the operating room and placed in the prone position  2 pillows under the legs.  Standard OR protocol was followed. The skin over the right intercostal was prepped and draped in sterile fashion. Strict sterile precautions were used throughout. Lidocaine 1% was used for skin and subcutaneous infiltration. Procedure:  Under fluoroscopy with strict sterile technique right intercostal nerve block at T12, T11, T10 with 22 gauge spinal needle at each level needle was placed at the rib just lateral to the angle walked off inferiorly under live fluoroscopy, then the patient received 0.25% bupivacaine and total 4 0 milligram tab personal he received 2 cubic centimeters at each level no heme or paresthesia or airTreatment plan:  1 to 2 months  Rondi Ivy, MD6/16/20219:14 AM

## 2019-07-28 MED ORDER — ALPRAZOLAM 0.5 MG TABLET
0.5 | ORAL_TABLET | Freq: Every evening | ORAL | 1 refills | 25.00000 days | Status: AC | PRN
Start: 2019-07-28 — End: 2020-02-16

## 2019-08-01 ENCOUNTER — Inpatient Hospital Stay: Admit: 2019-08-01 | Discharge: 2019-08-01 | Payer: BLUE CROSS/BLUE SHIELD

## 2019-08-01 ENCOUNTER — Inpatient Hospital Stay: Admit: 2019-08-01 | Discharge: 2019-08-01 | Disposition: A | Discharge status: Home / Self Care | Payer: Self-pay

## 2019-08-01 DIAGNOSIS — N281 Cyst of kidney, acquired: Secondary | ICD-10-CM

## 2019-08-01 DIAGNOSIS — Z0000 Encounter for general adult medical examination without abnormal findings: Secondary | ICD-10-CM

## 2019-08-01 DIAGNOSIS — N2889 Other specified disorders of kidney and ureter: Secondary | ICD-10-CM

## 2019-08-01 LAB — LIPID PANEL
BKR CHOLESTEROL: 214 mg/dL — ABNORMAL HIGH
BKR HDL CHOLESTEROL: 53 mg/dL
BKR HEMOGLOBIN A1C: 145 mg/dL (ref 4.2–6.3)
BKR LDL CHOLESTEROL CALCULATED: 132 mg/dL — ABNORMAL HIGH
BKR TRIGLYCERIDES: 145 mg/dL

## 2019-08-01 LAB — PSA, TOTAL (SCREENING) (BH GH L LMW YH): BKR PROSTATE SPECIFIC ANTIGEN, SCREENING: 1.02 ng/mL (ref <4.000)

## 2019-08-01 LAB — TSH W/REFLEX TO FT4     (BH GH LMW Q YH): BKR THYROID STIMULATING HORMONE (LM): 1.24 u[IU]/mL (ref 0.36–3.74)

## 2019-08-01 MED ORDER — SODIUM CHLORIDE 0.9 % BOLUS (NEW BAG)
0.9 % | Freq: Once | INTRAVENOUS | Status: CP | PRN
Start: 2019-08-01 — End: ?
  Administered 2019-08-01: 15:00:00 0.9 mL/h via INTRAVENOUS

## 2019-08-01 MED ORDER — GADOTERATE MEGLUMINE 0.5 MMOL/ML (376.9 MG/ML) INTRAVENOUS SOLUTION
0.5 mmol/mL (376.9 mg/mL) | Freq: Once | INTRAVENOUS | Status: CP | PRN
Start: 2019-08-01 — End: ?
  Administered 2019-08-01: 15:00:00 0.5 mL via INTRAVENOUS

## 2019-08-01 NOTE — Other
Labs reviewed with patient via mychart.

## 2019-08-04 ENCOUNTER — Ambulatory Visit: Admit: 2019-08-04 | Payer: PRIVATE HEALTH INSURANCE | Attending: Urology

## 2019-08-08 ENCOUNTER — Ambulatory Visit: Admit: 2019-08-08 | Payer: PRIVATE HEALTH INSURANCE | Attending: Urology

## 2019-08-15 ENCOUNTER — Ambulatory Visit: Admit: 2019-08-15 | Payer: PRIVATE HEALTH INSURANCE | Attending: Urology

## 2019-08-15 ENCOUNTER — Encounter: Admit: 2019-08-15 | Payer: PRIVATE HEALTH INSURANCE | Attending: Urology

## 2019-08-15 DIAGNOSIS — I639 Cerebral infarction, unspecified: Secondary | ICD-10-CM

## 2019-08-15 DIAGNOSIS — E7800 Pure hypercholesterolemia, unspecified: Secondary | ICD-10-CM

## 2019-08-15 DIAGNOSIS — C801 Malignant (primary) neoplasm, unspecified: Secondary | ICD-10-CM

## 2019-08-15 DIAGNOSIS — C499 Malignant neoplasm of connective and soft tissue, unspecified: Secondary | ICD-10-CM

## 2019-08-15 DIAGNOSIS — I10 Essential (primary) hypertension: Secondary | ICD-10-CM

## 2019-08-15 NOTE — Progress Notes
Philip [name] + [facility]	Urology Office NoteDate of visit: 7/20/2021Followup for:  Renal cystic lesionHistory of Present Illness: Philip [name] is a 59 y.o. gentleman with family history of renal cell carcinoma, prior stroke, gastroesophageal reflux, hypertension if she referred for renal cysts seen on ultrasound.  He was sent for MRI to better characterize the area in question and presents today to review that.  He feels well overall with no new reported medical issues.  He did undergo a intercostal nerve block due to flank pain which improved his pain significantly.PMH:  has a past medical history of Cancer (HC Code), Hypercholesteremia, Hypertension, Leiomyosarcoma (HC Code), and Stroke (HC Code).PSH:  has a past surgical history that includes Patent foramen ovale closure (2016); Hand surgery (Right); LEIOMYOSARCOMA EXCISION (Right); Shoulder arthroscopy (Right); Shoulder arthroscopy (Right); Colonoscopy; and Upper gastrointestinal endoscopy.Medications: Current Outpatient Medications: ?  ALPRAZolam (XANAX) 0.5 mg tablet, Take 1 tablet (0.5 mg total) by mouth nightly as needed for anxiety., Disp: 30 tablet, Rfl: 0?  aspirin 81 mg EC delayed release tablet, Take 81 mg by mouth daily., Disp: , Rfl: ?  ketoconazole (NIZORAL) 2 % cream, APPLY TWICE DAILY TO RASH ON SCALP AND FACE., Disp: , Rfl: ?  pravastatin (PRAVACHOL) 40 mg tablet, Take 1 tablet (40 mg total) by mouth nightly., Disp: 90 tablet, Rfl: 3?  triamcinolone (KENALOG) 0.025 % cream, APPLY TWICE A DAY TO RASH ON FACE UP TO 2 WEEKS A MONTH FOR FLARE UPS, Disp: , Rfl: ?  valsartan (DIOVAN) 160 mg tablet, TAKE 1 TABLET BY MOUTH ONCE DAILY, Disp: 90 tablet, Rfl: 3Allergy: Allergies Allergen Reactions ? Amoxicillin Nausea And Vomiting Social: Social History Socioeconomic History ? Marital status: Married   Spouse name: Not on file ? Number of children: Not on file ? Years of education: Not on file ? Highest education level: Not on file Occupational History ? Not on file Social Needs ? Financial resource strain: Not on file ? Food insecurity   Worry: Not on file   Inability: Not on file ? Transportation needs   Medical: Not on file   Non-medical: Not on file Tobacco Use ? Smoking status: Never Smoker ? Smokeless tobacco: Never Used Substance and Sexual Activity ? Alcohol use: Yes   Frequency: 2-4 times a month   Drinks per session: 1 or 2   Comment: 5 DRINKS A WEEK ? Drug use: Never ? Sexual activity: Yes Lifestyle ? Physical activity   Days per week: Not on file   Minutes per session: Not on file ? Stress: Not on file Relationships ? Social Manufacturing systems engineer on phone: Not on file   Gets together: Not on file   Attends religious service: Not on file   Active member of club or organization: Not on file   Attends meetings of clubs or organizations: Not on file   Relationship status: Not on file ? Intimate partner violence   Fear of current or ex partner: Not on file   Emotionally abused: Not on file   Physically abused: Not on file   Forced sexual activity: Not on file Other Topics Concern ? Not on file Social History Narrative ? Not on file Family: Family History Problem Relation Age of Onset ? Kidney cancer Father  ? Hypertension Father  ? Heart failure Paternal Grandmother  ? Cancer Paternal Grandfather  Exam: Vitals:  08/15/19 1342 BP: 117/63 Pulse: (!) 47 Resp: 18 Temp: 97.5 ?F (36.4 ?C)  Body mass index is 29.29 kg/m?Marland KitchenConstitutional & General:  Normal development and no deformities. No gross nutritional deficits. Normal habitus.  Well groomed. Well-appearing.Labs: Lab Results Component Value Date  WBC 4.72 12/19/2018  HGB 13.7 12/19/2018  HCT 42.2 12/19/2018  MCV 93.0 12/19/2018 PLT 212 12/19/2018   Chemistry    Component Value Date/Time  NA 137 12/19/2018 1256  K 4.2 12/19/2018 1256  CL 107 12/19/2018 1256  CO2 26 12/19/2018 1256  BUN 14 12/19/2018 1256  CREATININE 0.92 12/19/2018 1256  GLU 90 12/19/2018 1256  PROT 7.3 12/19/2018 1256    Component Value Date/Time  CALCIUM 8.7 12/19/2018 1256  ALKPHOS 66 12/19/2018 1256  AST 24 12/19/2018 1256  AST 21 07/10/2014 0816  ALT 28 12/19/2018 1256  ALT 33 07/10/2014 0816  BILITOT 0.7 12/19/2018 1256  ALBUMIN 3.7 12/19/2018 1256  Lab Results Component Value Date/Time  PSA 1.020 08/01/2019 08:56 AM  PSA 0.770 07/20/2018 02:00 PM  PSA 0.760 04/07/2018 08:40 AM  PSA 0.720 10/05/2016 09:03 AM Pathology:  NoneImaging: I reviewed the images and radiologist's report of the following:[place] [facility] [address] [place], [place] [id number]?MRI ABDOMEN W WO IV CONTRAST?[name]       Sex: Philip [name] [id number]  MRN: [id number]?Service Date: 2019-08-01 09:54:27?MRI ABDOMEN W WO IV CONTRAST?Clinical Indication:Renal cyst?The MRI scan of the abdomen was performed multiple pulse sequences in the axial and coronal projections. Study was performed without and with the use of intravenous contrast. No prior studies available for comparison.?There is a small, 1.1 cm irregular shaped cyst in the lateral aspect of the right kidney. There are also a few other very small masses in the right kidney which is too small to characterize and may also represent cyst.?In the left kidney has a partially exophytic mass projecting from the posterior lateral aspect of the left kidney. This mass measures one point April 1.3 cm in size. It and has an irregular shaped and appears cystic in nature but has mild contrast enhancement of the wall which has irregular. This has the appearance of a complex cystic mass. No other definite renal masses are seen.?The adrenal glands are normal in size. The liver and spleen are not entirely imaged. No abnormalities noted. The pancreas is unremarkable.?Impression:  There is a small complex cystic mass of the left kidney. Follow up evaluation is suggested. There is a small cyst in the right kidney. There are also other small renal masses in the right kidney too small to characterize.?Signed By: Dalbert Batman MD, 2019/08/02 18:49:43Impression: Coe Angelos is a 59 y.o. gentleman with left-sided 1.3 centimeter indeterminate renal mass, cystic componentsPlan:   - renal cystic lesion-MRI was reviewed with the patient today which demonstrates a 1.3 centimeter cystic complex mass in the left kidney.  Notably this is majority cystic in nature but does have a small amount of enhancement.  It appears to be roughly the same size as previous [place] scan as measured in the office today.  Reviewed the options including continued surveillance with plans for intervention if the lesion approaches 3 centimeters in size.  We discussed partial nephrectomy versus percutaneous ablation as options.  Patient is somewhat reluctant to continue observation given his family history but would like to consider his options further and will call back if he makes a decision.  Otherwise will plan for follow-up appointment in 6 months with repeat MRI prior.Yisroel Ramming, MDAssistant Professor of [facility] of [address], [place] [phone]/20/2021

## 2019-08-16 ENCOUNTER — Encounter: Admit: 2019-08-16 | Payer: PRIVATE HEALTH INSURANCE

## 2019-08-16 ENCOUNTER — Telehealth: Admit: 2019-08-16 | Payer: PRIVATE HEALTH INSURANCE | Attending: Urology

## 2019-08-16 DIAGNOSIS — N281 Cyst of kidney, acquired: Secondary | ICD-10-CM

## 2019-08-16 DIAGNOSIS — N2889 Other specified disorders of kidney and ureter: Secondary | ICD-10-CM

## 2019-08-16 NOTE — Telephone Encounter
Spoke with Philip [name] and scheduled surgery for 09/04/2019. Pre-operative instructions reviewed over the phone and sent via MyChart (review myChart message for instruction documentation). Will get cardiac clearance, EKG, non-fasting blood work, and u/c done prior to surgery. Will call with any questions or concerns. Philip [name] verbalizes understanding to instructions.

## 2019-08-16 NOTE — Telephone Encounter
Patient calling stating he scheduled left-sided robotic partial nephrectomy on August 9th, however he can no longer do that date. Please call him back to reschedule at [phone].

## 2019-08-16 NOTE — Telephone Encounter
Please reach out to the patient and set him up for left-sided robotic partial nephrectomy.  Can offer him August 9th as a date.

## 2019-08-16 NOTE — Telephone Encounter
Message sent to surgical schedulers

## 2019-08-17 NOTE — Telephone Encounter
Pt sent in a mychart message that he can no longer do surgery on the 9th. Will route message to surgical schedulers so they can reach out to patient to discuss alternate dates.

## 2019-08-17 NOTE — Telephone Encounter
Patient's surgery has been rescheduled from 8/9 to 8/30 with Dr. Genice Rouge. Patient aware.

## 2019-08-29 ENCOUNTER — Encounter: Admit: 2019-08-29 | Payer: PRIVATE HEALTH INSURANCE | Attending: Urology

## 2019-09-11 ENCOUNTER — Encounter: Admit: 2019-09-11 | Payer: PRIVATE HEALTH INSURANCE | Attending: Family

## 2019-09-11 DIAGNOSIS — Z01818 Encounter for other preprocedural examination: Secondary | ICD-10-CM

## 2019-09-14 ENCOUNTER — Telehealth: Admit: 2019-09-14 | Payer: PRIVATE HEALTH INSURANCE | Attending: Urology

## 2019-09-14 NOTE — Telephone Encounter
Called patient to remind him to have his pre-op lab work and urine culture done for surgery on 8/30. Cardiac clearance scheduled for 8/25.

## 2019-09-15 ENCOUNTER — Encounter: Admit: 2019-09-15 | Payer: PRIVATE HEALTH INSURANCE | Attending: Urology

## 2019-09-15 DIAGNOSIS — C499 Malignant neoplasm of connective and soft tissue, unspecified: Secondary | ICD-10-CM

## 2019-09-15 DIAGNOSIS — C801 Malignant (primary) neoplasm, unspecified: Secondary | ICD-10-CM

## 2019-09-15 DIAGNOSIS — I499 Cardiac arrhythmia, unspecified: Secondary | ICD-10-CM

## 2019-09-15 DIAGNOSIS — E7800 Pure hypercholesterolemia, unspecified: Secondary | ICD-10-CM

## 2019-09-15 DIAGNOSIS — I10 Essential (primary) hypertension: Secondary | ICD-10-CM

## 2019-09-15 DIAGNOSIS — I639 Cerebral infarction, unspecified: Secondary | ICD-10-CM

## 2019-09-15 DIAGNOSIS — K219 Gastro-esophageal reflux disease without esophagitis: Secondary | ICD-10-CM

## 2019-09-15 DIAGNOSIS — Q2112 PFO (patent foramen ovale): Secondary | ICD-10-CM

## 2019-09-19 ENCOUNTER — Inpatient Hospital Stay: Admit: 2019-09-19 | Discharge: 2019-09-19 | Payer: BLUE CROSS/BLUE SHIELD

## 2019-09-19 DIAGNOSIS — N2889 Other specified disorders of kidney and ureter: Secondary | ICD-10-CM

## 2019-09-19 LAB — COMPREHENSIVE METABOLIC PANEL
BKR ALANINE AMINOTRANSFERASE (ALT): 43 U/L (ref 16–61)
BKR ALBUMIN: 4 g/dL (ref 3.4–5.0)
BKR ALKALINE PHOSPHATASE: 68 U/L (ref 45–117)
BKR ANION GAP (LM): 4 mmol/L — ABNORMAL LOW (ref 5–15)
BKR ASPARTATE AMINOTRANSFERASE (AST): 23 U/L (ref 15–37)
BKR BILIRUBIN TOTAL: 0.6 mg/dL (ref <1.0)
BKR BLOOD UREA NITROGEN: 13 mg/dL (ref 7–18)
BKR CALCIUM: 9.1 mg/dL (ref 8.5–10.1)
BKR CHLORIDE: 106 mmol/L (ref 98–107)
BKR CO2: 28 mmol/L (ref 21–32)
BKR CREATININE: 0.99 mg/dL (ref 0.70–1.30)
BKR EGFR (AFR AMER) (LMC): >60 mL/min/{1.73_m2} (ref >60)
BKR EGFR (NON AFR AMER) (LMC): >60 mL/min/{1.73_m2} (ref >60)
BKR GLOBULIN: 3.6 g/dL (ref 2.5–5.0)
BKR GLUCOSE: 93 mg/dL (ref 65–110)
BKR POTASSIUM: 4.2 mmol/L (ref 3.5–5.1)
BKR PROTEIN TOTAL: 7.6 g/dL (ref 6.4–8.2)
BKR SODIUM: 138 mmol/L (ref 136–145)
BKR WAM MCH PG: 28 mmol/L (ref 21–32)

## 2019-09-19 LAB — CBC WITH AUTO DIFFERENTIAL
BKR WAM ABSOLUTE IMMATURE GRANULOCYTES.: 0.01 x 1000/??L (ref 0.00–0.10)
BKR WAM ABSOLUTE LYMPHOCYTE COUNT.: 1.72 x 1000/??L (ref 1.00–4.50)
BKR WAM ABSOLUTE NEUTROPHIL COUNT.: 2.31 x 1000/ÂµL (ref 1.40–6.60)
BKR WAM BASOPHIL ABSOLUTE COUNT.: 0.04 x 1000/??L (ref 0.0–0.3)
BKR WAM BASOPHILS: 0.8 % (ref 0.0–3.0)
BKR WAM EOSINOPHIL ABSOLUTE COUNT.: 0.17 x 1000/??L (ref 0.00–0.45)
BKR WAM EOSINOPHILS: 3.6 % (ref 0.0–4.0)
BKR WAM HEMATOCRIT: 44.6 % (ref 40.0–50.0)
BKR WAM HEMOGLOBIN: 14.9 g/dL (ref 13.0–17.0)
BKR WAM IMMATURE GRANULOCYTES: 0.2 % (ref 0.0–1.0)
BKR WAM LYMPHOCYTES: 36 % (ref 25.0–45.0)
BKR WAM MCHC: 33.4 g/dL — ABNORMAL LOW (ref 32.0–36.0)
BKR WAM MCV: 91.2 fL (ref 79.0–99.0)
BKR WAM MONOCYTE ABSOLUTE COUNT.: 0.53 x 1000/??L (ref 0.00–1.20)
BKR WAM MONOCYTES: 11.1 % (ref 0.0–12.0)
BKR WAM MPV: 10 fL (ref 7.5–11.5)
BKR WAM NEUTROPHILS: 48.3 % (ref 36.0–66.0)
BKR WAM NUCLEATED RED BLOOD CELLS: 0 % (ref 0.0–5.0)
BKR WAM PLATELETS: 253 x1000/??L (ref 140–400)
BKR WAM RDW-CV: 13.1 % (ref 11.5–14.5)
BKR WAM RED BLOOD CELL COUNT.: 4.89 M/??L (ref 4.00–5.20)
BKR WAM WHITE BLOOD CELL COUNT.: 4.78 x1000/??L (ref 4.00–10.00)

## 2019-09-20 LAB — URINE CULTURE: BKR URINE CULTURE, ROUTINE: NO GROWTH

## 2019-09-21 NOTE — Other
PHARMACY-ASSISTED MEDICATION REPORT    Pharmacist review of the best possible medication history obtained by the pharmacy medication history technician has been performed.      I have updated the home medication list and identified the following information that may be relevant to this admission.      NOTES/RECOMMENDATIONS   Medication list was updated to best possible state. Patient provided some medication history but could not recall exact dose/frequency on their own during interview. The following resources were utilized to confirm the list: Pharmacy record - Biomedical engineer and Clinic record (Epic, phone call, fax).   Medication reconciliation completed prior to admission orders, please re-start home medications when appropriate.                   Prior to Admission Medications     Medication Name Sig Taking? Patient Reported       ALPRAZolam Prudy Feeler) 0.5 mg tablet  Last dose: Not Taking at Unknown time  Last Medication Note: >> David Stall Sep 20, 2019  3:50 PM  [facility] Tech(Aileen Elmyra Ricks, CPHT):  Patient reported that he never take this medication but last filled was 07/28/19, qty: [facility] at [facility]     Entered by Marvell Fuller, CPHT Wed Sep 20, 2019 1550 Take 1 tablet (0.5 mg total) by mouth nightly as needed for anxiety.  Patient not taking: Reported on 09/20/2019        **Flagged for Removal**     aspirin 81 mg EC delayed release tablet  Last dose:  --   Last Medication Note: >> David Stall Sep 20, 2019  3:49 PM  [facility] Tech(Aileen Elmyra Ricks, CPHT):  Patient reported should be taking, but hold off since Sunday prior to procedure.     Entered by Marvell Fuller, CPHT Wed Sep 20, 2019 1549 Take 81 mg by mouth daily. Yes Yes       ketoconazole (NIZORAL) 2 % cream  Last dose:  --   Last Medication Note: >> David Stall Sep 20, 2019  3:49 PM  [facility] Tech(Aileen Elmyra Ricks, CPHT):  Patient reported that he use as PRN  Entered by Marvell Fuller, CPHT Wed Sep 20, 2019 1549 Apply 1 Application topically 2 (two) times daily. To rash on scalp and face Yes Yes       pravastatin (PRAVACHOL) 40 mg tablet  Last dose:  --  Take 1 tablet (40 mg total) by mouth nightly. Yes         triamcinolone (KENALOG) 0.025 % cream  Last dose:  --   Last Medication Note: >> David Stall Sep 20, 2019  3:48 PM  [facility] Tech(Aileen Elmyra Ricks, CPHT): Patient reported that he use as PRN       Entered by Marvell Fuller, CPHT Wed Sep 20, 2019 1548 Apply topically 2 (two) times daily. To rash on face up to 2 weeks a month for flare ups Yes Yes       valsartan (DIOVAN) 160 mg tablet  Last dose:  --  TAKE 1 TABLET BY MOUTH ONCE DAILY  Patient taking differently: Take 160 mg by mouth daily.  Yes         Prior to admission medications last reviewed by Aldean Baker, PharmD on Wed Sep 20, 2019 2041         Thank you,  Aldean Baker, PharmD  09/20/2019  8:41 PM  Phone: [phone]

## 2019-09-22 ENCOUNTER — Inpatient Hospital Stay: Admit: 2019-09-22 | Discharge: 2019-09-22 | Payer: BLUE CROSS/BLUE SHIELD

## 2019-09-22 DIAGNOSIS — Z01818 Encounter for other preprocedural examination: Secondary | ICD-10-CM

## 2019-09-22 DIAGNOSIS — Z01812 Encounter for preprocedural laboratory examination: Secondary | ICD-10-CM

## 2019-09-22 DIAGNOSIS — Z20822 Contact with and (suspected) exposure to covid-19: Secondary | ICD-10-CM

## 2019-09-22 LAB — COVID-19 CLEARANCE OR FOR PLACEMENT ONLY: BKR SARS-COV-2 RNA (COVID-19) (YH): NEGATIVE mmol/L (ref 98–107)

## 2019-09-24 ENCOUNTER — Ambulatory Visit: Admit: 2019-09-24 | Payer: PRIVATE HEALTH INSURANCE | Attending: Certified Registered"

## 2019-09-24 DIAGNOSIS — N2889 Other specified disorders of kidney and ureter: Secondary | ICD-10-CM

## 2019-09-25 ENCOUNTER — Ambulatory Visit: Admit: 2019-09-25 | Payer: PRIVATE HEALTH INSURANCE | Attending: Certified Registered"

## 2019-09-25 ENCOUNTER — Encounter: Admit: 2019-09-25 | Payer: PRIVATE HEALTH INSURANCE | Attending: Urology

## 2019-09-25 ENCOUNTER — Inpatient Hospital Stay: Admit: 2019-09-25 | Discharge: 2019-09-26 | Payer: BLUE CROSS/BLUE SHIELD

## 2019-09-25 DIAGNOSIS — I639 Cerebral infarction, unspecified: Secondary | ICD-10-CM

## 2019-09-25 DIAGNOSIS — I10 Essential (primary) hypertension: Secondary | ICD-10-CM

## 2019-09-25 DIAGNOSIS — C801 Malignant (primary) neoplasm, unspecified: Secondary | ICD-10-CM

## 2019-09-25 DIAGNOSIS — N2889 Other specified disorders of kidney and ureter: Secondary | ICD-10-CM

## 2019-09-25 DIAGNOSIS — Q2112 PFO (patent foramen ovale): Secondary | ICD-10-CM

## 2019-09-25 DIAGNOSIS — I499 Cardiac arrhythmia, unspecified: Secondary | ICD-10-CM

## 2019-09-25 DIAGNOSIS — C499 Malignant neoplasm of connective and soft tissue, unspecified: Secondary | ICD-10-CM

## 2019-09-25 DIAGNOSIS — E7800 Pure hypercholesterolemia, unspecified: Secondary | ICD-10-CM

## 2019-09-25 DIAGNOSIS — K219 Gastro-esophageal reflux disease without esophagitis: Secondary | ICD-10-CM

## 2019-09-25 MED ORDER — OXYCODONE IMMEDIATE RELEASE 5 MG TABLET
5 mg | ORAL | Status: DC | PRN
Start: 2019-09-25 — End: 2019-09-26

## 2019-09-25 MED ORDER — SODIUM CHLORIDE 0.9 % (FLUSH) INJECTION SYRINGE
0.9 % | Freq: Three times a day (TID) | INTRAVENOUS | Status: DC
Start: 2019-09-25 — End: 2019-09-26

## 2019-09-25 MED ORDER — FENTANYL (PF) 50 MCG/ML INJECTION SOLUTION
50 mcg/mL | INTRAVENOUS | Status: DC | PRN
Start: 2019-09-25 — End: 2019-09-26

## 2019-09-25 MED ORDER — DOCUSATE SODIUM 100 MG CAPSULE
100 mg | Freq: Two times a day (BID) | ORAL | Status: DC
Start: 2019-09-25 — End: 2019-09-26
  Administered 2019-09-26 (×2): 100 mg via ORAL

## 2019-09-25 MED ORDER — PROPOFOL 10 MG/ML INTRAVENOUS EMULSION
10 mg/mL | INTRAVENOUS | Status: DC | PRN
Start: 2019-09-25 — End: 2019-09-25
  Administered 2019-09-25: 18:00:00 10 mg/mL via INTRAVENOUS

## 2019-09-25 MED ORDER — DEXAMETHASONE SODIUM PHOSPHATE 4 MG/ML INJECTION SOLUTION
4 mg/mL | INTRAVENOUS | Status: DC | PRN
Start: 2019-09-25 — End: 2019-09-25
  Administered 2019-09-25: 18:00:00 4 mg/mL via INTRAVENOUS

## 2019-09-25 MED ORDER — HEPARIN (PORCINE) 5,000 UNIT/ML INJECTION SOLUTION
5000 unit/mL | Freq: Three times a day (TID) | SUBCUTANEOUS | Status: DC
Start: 2019-09-25 — End: 2019-09-26
  Administered 2019-09-26 (×2): via SUBCUTANEOUS

## 2019-09-25 MED ORDER — ONDANSETRON 4 MG DISINTEGRATING TABLET
4 mg | Freq: Four times a day (QID) | ORAL | Status: DC | PRN
Start: 2019-09-25 — End: 2019-09-26

## 2019-09-25 MED ORDER — SUGAMMADEX 100 MG/ML INTRAVENOUS SOLUTION
100 mg/mL | INTRAVENOUS | Status: DC | PRN
Start: 2019-09-25 — End: 2019-09-25
  Administered 2019-09-25 (×4): 100 mg/mL via INTRAVENOUS

## 2019-09-25 MED ORDER — SODIUM CHLORIDE 0.9 % IRRIGATION SOLUTION
0.9 % irrigation | Status: CP | PRN
Start: 2019-09-25 — End: ?
  Administered 2019-09-25: 19:00:00 0.9 % irrigation

## 2019-09-25 MED ORDER — CEFAZOLIN IV PUSH 1 GRAM VIAL & 0.9% SODIUM CHLORIDE (ADULT)
Freq: Once | INTRAVENOUS | Status: DC
Start: 2019-09-25 — End: 2019-09-26

## 2019-09-25 MED ORDER — ONDANSETRON HCL (PF) 4 MG/2 ML INJECTION SOLUTION
4 mg/2 mL | INTRAVENOUS | Status: DC | PRN
Start: 2019-09-25 — End: 2019-09-26

## 2019-09-25 MED ORDER — LACTATED RINGERS INTRAVENOUS SOLUTION
INTRAVENOUS | Status: DC
Start: 2019-09-25 — End: 2019-09-26
  Administered 2019-09-26: 02:00:00 1000.000 mL/h via INTRAVENOUS

## 2019-09-25 MED ORDER — HYDROMORPHONE 0.5 MG/0.5 ML INJECTION SYRINGE
0.5 mg/ mL | INTRAVENOUS | Status: DC | PRN
Start: 2019-09-25 — End: 2019-09-26

## 2019-09-25 MED ORDER — ACETAMINOPHEN 325 MG TABLET
325 mg | Freq: Four times a day (QID) | ORAL | Status: DC
Start: 2019-09-25 — End: 2019-09-26
  Administered 2019-09-26 (×4): 325 mg via ORAL

## 2019-09-25 MED ORDER — BUPIVACAINE (PF) 0.5 % (5 MG/ML) INJECTION SOLUTION
0.55 % (5 mg/mL) | Status: DC | PRN
Start: 2019-09-25 — End: 2019-09-25
  Administered 2019-09-25: 19:00:00 0.5 % (5 mg/mL)

## 2019-09-25 MED ORDER — ROSUVASTATIN 5 MG TABLET
5 mg | Freq: Every day | ORAL | Status: DC
Start: 2019-09-25 — End: 2019-09-26
  Administered 2019-09-26: 14:00:00 5 mg via ORAL

## 2019-09-25 MED ORDER — PROCHLORPERAZINE MALEATE 10 MG TABLET
10 mg | Freq: Four times a day (QID) | ORAL | Status: DC | PRN
Start: 2019-09-25 — End: 2019-09-26

## 2019-09-25 MED ORDER — KETOROLAC 15 MG/ML INJECTION SOLUTION
15 mg/mL | Freq: Four times a day (QID) | INTRAVENOUS | Status: DC | PRN
Start: 2019-09-25 — End: 2019-09-26
  Administered 2019-09-26: 01:00:00 15 mL via INTRAVENOUS

## 2019-09-25 MED ORDER — LACTATED RINGERS INTRAVENOUS SOLUTION
INTRAVENOUS | Status: DC
Start: 2019-09-25 — End: 2019-09-26
  Administered 2019-09-25: 15:00:00 1000.000 mL/h via INTRAVENOUS

## 2019-09-25 MED ORDER — FENTANYL (PF) 50 MCG/ML INJECTION SOLUTION
50 mcg/mL | INTRAVENOUS | Status: DC | PRN
Start: 2019-09-25 — End: 2019-09-25
  Administered 2019-09-25: 18:00:00 50 mcg/mL via INTRAVENOUS

## 2019-09-25 MED ORDER — PROCHLORPERAZINE EDISYLATE 10 MG/2 ML (5 MG/ML) INJECTION SOLUTION
1025 mg/2 mL (5 mg/mL) | Freq: Four times a day (QID) | INTRAVENOUS | Status: DC | PRN
Start: 2019-09-25 — End: 2019-09-26

## 2019-09-25 MED ORDER — PROCHLORPERAZINE EDISYLATE 10 MG/2 ML (5 MG/ML) INJECTION SOLUTION
10 mg/2 mL (5 mg/mL) | INTRAVENOUS | Status: CP | PRN
Start: 2019-09-25 — End: ?
  Administered 2019-09-25 – 2019-09-26 (×2): 10 mL via INTRAVENOUS

## 2019-09-25 MED ORDER — SODIUM CHLORIDE 0.9 % (FLUSH) INJECTION SYRINGE
0.9 % | INTRAVENOUS | Status: DC | PRN
Start: 2019-09-25 — End: 2019-09-26

## 2019-09-25 MED ORDER — MIDAZOLAM (PF) 1 MG/ML INJECTION SOLUTION
1 mg/mL | Status: CP
Start: 2019-09-25 — End: ?

## 2019-09-25 MED ORDER — LACTATED RINGERS INTRAVENOUS SOLUTION
INTRAVENOUS | Status: DC
Start: 2019-09-25 — End: 2019-09-26

## 2019-09-25 MED ORDER — LACTATED RINGERS INTRAVENOUS SOLUTION
INTRAVENOUS | Status: DC
Start: 2019-09-25 — End: 2019-09-26
  Administered 2019-09-25: 22:00:00 1000.000 mL/h via INTRAVENOUS

## 2019-09-25 MED ORDER — FAMOTIDINE 20 MG TABLET
20 mg | Freq: Once | ORAL | Status: CP
Start: 2019-09-25 — End: ?
  Administered 2019-09-25: 15:00:00 20 mg via ORAL

## 2019-09-25 MED ORDER — ROCURONIUM 10 MG/ML INTRAVENOUS SOLUTION
10 mg/mL | INTRAVENOUS | Status: DC | PRN
Start: 2019-09-25 — End: 2019-09-25
  Administered 2019-09-25 (×3): 10 mg/mL via INTRAVENOUS

## 2019-09-25 MED ORDER — ONDANSETRON HCL (PF) 4 MG/2 ML INJECTION SOLUTION
42 mg/2 mL | Freq: Four times a day (QID) | INTRAVENOUS | Status: DC | PRN
Start: 2019-09-25 — End: 2019-09-26

## 2019-09-25 MED ORDER — DIPHENHYDRAMINE 50 MG/ML INJECTION SOLUTION
50 mg/mL | Freq: Once | INTRAVENOUS | Status: DC | PRN
Start: 2019-09-25 — End: 2019-09-26

## 2019-09-25 MED ORDER — HYDROMORPHONE 2 MG/ML INJECTION SOLUTION
2 mg/mL | INTRAVENOUS | Status: DC | PRN
Start: 2019-09-25 — End: 2019-09-25
  Administered 2019-09-25 (×2): 2 mg/mL via INTRAVENOUS

## 2019-09-25 MED ORDER — FENTANYL (PF) 50 MCG/ML INJECTION SOLUTION
50 mcg/mL | Status: CP
Start: 2019-09-25 — End: ?

## 2019-09-25 MED ORDER — ONDANSETRON HCL (PF) 4 MG/2 ML INJECTION SOLUTION
42 mg/2 mL | INTRAVENOUS | Status: DC | PRN
Start: 2019-09-25 — End: 2019-09-25
  Administered 2019-09-25: 18:00:00 4 mg/2 mL via INTRAVENOUS

## 2019-09-25 MED ORDER — CEFAZOLIN 1 GRAM SOLUTION FOR INJECTION
1 gram | INTRAVENOUS | Status: DC | PRN
Start: 2019-09-25 — End: 2019-09-25
  Administered 2019-09-25: 18:00:00 1 gram via INTRAVENOUS

## 2019-09-25 MED ORDER — HEPARIN (PORCINE) 5,000 UNIT/ML INJECTION SOLUTION
5000 unit/mL | Freq: Once | SUBCUTANEOUS | Status: CP
Start: 2019-09-25 — End: ?
  Administered 2019-09-25: 15:00:00 via SUBCUTANEOUS

## 2019-09-25 MED ORDER — ACETAMINOPHEN 500 MG TABLET
500 mg | Freq: Once | ORAL | Status: CP
Start: 2019-09-25 — End: ?
  Administered 2019-09-25: 15:00:00 500 mg via ORAL

## 2019-09-25 MED ORDER — LIDOCAINE HCL 20 MG/ML (2 %) INJECTION SOLUTION
202 mg/mL (2 %) | INTRAVENOUS | Status: DC | PRN
Start: 2019-09-25 — End: 2019-09-25
  Administered 2019-09-25: 18:00:00 20 mg/mL (2 %) via INTRAVENOUS

## 2019-09-25 MED ORDER — HYDROMORPHONE 2 MG/ML INJECTION SOLUTION
2 mg/mL | Status: CP
Start: 2019-09-25 — End: ?

## 2019-09-25 MED ORDER — KETOROLAC 15 MG/ML INJECTION SOLUTION
15 mg/mL | Freq: Four times a day (QID) | INTRAVENOUS | Status: DC | PRN
Start: 2019-09-25 — End: 2019-09-26
  Administered 2019-09-26: 14:00:00 15 mL via INTRAVENOUS

## 2019-09-25 MED ORDER — BUPIVACAINE (PF) 0.5 % (5 MG/ML) INJECTION SOLUTION
0.5 % (5 mg/mL) | Status: CP
Start: 2019-09-25 — End: ?

## 2019-09-25 MED ORDER — MIDAZOLAM (PF) 1 MG/ML INJECTION SOLUTION
1 mg/mL | INTRAVENOUS | Status: DC | PRN
Start: 2019-09-25 — End: 2019-09-25
  Administered 2019-09-25: 18:00:00 1 mg/mL via INTRAVENOUS

## 2019-09-25 NOTE — Other
History of Present Illness: Philip [name] is a 59 y.o. gentleman with family history of renal cell carcinoma, prior stroke, gastroesophageal reflux, hypertension initially referred for renal cysts seen on ultrasound.  He was sent for MRI to better characterize the area in question which demonstrated a?1.8 centimeter complex cystic mass in the posterior aspect of the left kidney.  He was counseled on his options elected for partial nephrectomy presents for that procedure.?PMH:  has a past medical history of Cancer (HC Code), Hypercholesteremia, Hypertension, Leiomyosarcoma (HC Code), and Stroke (HC Code).?PSH:  has a past surgical history that includes Patent foramen ovale closure (2016); Hand surgery (Right); LEIOMYOSARCOMA EXCISION (Right); Shoulder arthroscopy (Right); Shoulder arthroscopy (Right); Colonoscopy; and Upper gastrointestinal endoscopy.?Medications: Current Outpatient Medications: ?  ALPRAZolam (XANAX) 0.5 mg tablet, Take 1 tablet (0.5 mg total) by mouth nightly as needed for anxiety., Disp: 30 tablet, Rfl: 0?  aspirin 81 mg EC delayed release tablet, Take 81 mg by mouth daily., Disp: , Rfl: ?  ketoconazole (NIZORAL) 2 % cream, APPLY TWICE DAILY TO RASH ON SCALP AND FACE., Disp: , Rfl: ?  pravastatin (PRAVACHOL) 40 mg tablet, Take 1 tablet (40 mg total) by mouth nightly., Disp: 90 tablet, Rfl: 3?  triamcinolone (KENALOG) 0.025 % cream, APPLY TWICE A DAY TO RASH ON FACE UP TO 2 WEEKS A MONTH FOR FLARE UPS, Disp: , Rfl: ?  valsartan (DIOVAN) 160 mg tablet, TAKE 1 TABLET BY MOUTH ONCE DAILY, Disp: 90 tablet, Rfl: 3?Allergy:    Allergies Allergen Reactions ? Amoxicillin Nausea And Vomiting ??Social: Social History  ?    Socioeconomic History ? Marital status: Married ? ? Spouse name: Not on file ? Number of children: Not on file ? Years of education: Not on file ? Highest education level: Not on file Occupational History ? Not on file Social Needs ? Financial resource strain: Not on file ? Food insecurity ? ? Worry: Not on file ? ? Inability: Not on file ? Transportation needs ? ? Medical: Not on file ? ? Non-medical: Not on file Tobacco Use ? Smoking status: Never Smoker ? Smokeless tobacco: Never Used Substance and Sexual Activity ? Alcohol use: Yes ? ? Frequency: 2-4 times a month ? ? Drinks per session: 1 or 2 ? ? Comment: 5 DRINKS A WEEK ? Drug use: Never ? Sexual activity: Yes Lifestyle ? Physical activity ? ? Days per week: Not on file ? ? Minutes per session: Not on file ? Stress: Not on file Relationships ? Social connections ? ? Talks on phone: Not on file ? ? Gets together: Not on file ? ? Attends religious service: Not on file ? ? Active member of club or organization: Not on file ? ? Attends meetings of clubs or organizations: Not on file ? ? Relationship status: Not on file ? Intimate partner violence ? ? Fear of current or ex partner: Not on file ? ? Emotionally abused: Not on file ? ? Physically abused: Not on file ? ? Forced sexual activity: Not on file Other Topics Concern ? Not on file Social History Narrative ? Not on file  ??Family:     Family History Problem Relation Age of Onset ? Kidney cancer Father ? ? Hypertension Father ? ? Heart failure Paternal Grandmother ? ? Cancer Paternal Grandfather ? ??Exam:   Vitals: ? 08/15/19 1342 BP: 117/63 Pulse: (!) 47 Resp: 18 Temp: 97.5 ?F (36.4 ?C)  Body mass index is 29.29 kg/m?Marland KitchenConstitutional & General: Normal development and no deformities. No  gross nutritional deficits. Normal habitus.  Well groomed. Well-appearing.Regular rhythm, S1-S2Lungs clear to auscultation bilaterallyAbdomen soft nontender nondistendedNo CVA tenderness??Labs:     Lab Results Component Value Date ? WBC 4.72 12/19/2018 ? HGB 13.7 12/19/2018 ? HCT 42.2 12/19/2018 ? MCV 93.0 12/19/2018 ? PLT 212 12/19/2018 ?  Chemistry ?       Component Value Date/Time ? NA 137 12/19/2018 1256 ? K 4.2 12/19/2018 1256 ? CL 107 12/19/2018 1256 ? CO2 26 12/19/2018 1256 ? BUN 14 12/19/2018 1256 ? CREATININE 0.92 12/19/2018 1256 ? GLU 90 12/19/2018 1256 ? PROT 7.3 12/19/2018 1256 ?       Component Value Date/Time ? CALCIUM 8.7 12/19/2018 1256 ? ALKPHOS 66 12/19/2018 1256 ? AST 24 12/19/2018 1256 ? AST 21 07/10/2014 0816 ? ALT 28 12/19/2018 1256 ? ALT 33 07/10/2014 0816 ? BILITOT 0.7 12/19/2018 1256 ? ALBUMIN 3.7 12/19/2018 1256 ? ??    Lab Results Component Value Date/Time ? PSA 1.020 08/01/2019 08:56 AM ? PSA 0.770 07/20/2018 02:00 PM ? PSA 0.760 04/07/2018 08:40 AM ? PSA 0.720 10/05/2016 09:03 AM ??Pathology:  None?Imaging: I reviewed the images and radiologist's report of the following:?[place] [facility] [address] [place], [place] [id number]?MRI ABDOMEN W WO IV CONTRAST?Philip [name] ??????Sex: M ?DOB: [id number] ?MRN: [id number]?Service Date: 2019-08-01 09:54:27?MRI ABDOMEN W WO IV CONTRAST?Clinical Indication:Renal cyst?The MRI scan of the abdomen was performed multiple pulse sequences in the axial and coronal projections. Study was performed without and with the use of intravenous contrast. No prior studies available for comparison.?There is a small, 1.1 cm irregular shaped cyst in the lateral aspect of the right kidney. There are also a few other very small masses in the right kidney which is too small to characterize and may also represent cyst.?In the left kidney has a partially exophytic mass projecting from the posterior lateral aspect of the left kidney. This mass measures one point April 1.3 cm in size. It and has an irregular shaped and appears cystic in nature but has mild contrast enhancement of the wall which has irregular. This has the appearance of a complex cystic mass. No other definite renal masses are seen.?The adrenal glands are normal in size. The liver and spleen are not entirely imaged. No abnormalities noted. The pancreas is unremarkable.?Impression: ?There is a small complex cystic mass of the left kidney. Follow up evaluation is suggested. There is a small cyst in the right kidney. There are also other small renal masses in the right kidney too small to characterize.?Signed By: Dalbert Batman MD, 2019/08/02 18:49:43?Impression: Philip [name] is a 59 y.o. gentleman with left-sided 1.8 centimeter indeterminate renal mass, cystic components - proceed to operating room for robotic assisted laparoscopic left-sided partial nephrectomy - plan for admission postop

## 2019-09-25 NOTE — Other
Patient Name: Philip [name]        MRN # [id number]  DOB:  August 23, 1960    Surgery Date: 8/30/2021Surgeon(s) and Role:   * Zira Helinski, Danelle Earthly, MD - PrimaryAssistant(s):PA 1st Assist: Suella Grove, PA; [place], [place], [place]:  Circulator: Drucilla Schmidt, RNRelief Circulator: Wilma Flavin, RNRelief Scrub: Sweet, AnnmarieScrub Person: Jamey Reas KPre-Op Diagnosis:  Left cystic renal massPost Operative Diagnosis:  Same Procedure(s) and Anesthesia Type:   * ERAS ROBOTIC ASSISTED LEFT LAPAROSCOPIC PARTIAL NEPHRECTOMY - intraoperative ultrasound with interpretation - GENERALOperative Indications:  This is a 59 year old male with a history cystic renal mass in the left kidney which was followed with MRI.  It showed slight interval growth.  Notably he does have a father with a history of kidney cancer.  He was given his options and elected for proceeding with removal.  Presents today for robotic partial nephrectomy.Operative Findings:  Left cystic renal mass removed entirelyDescription of procedure:  After appropriate identification confirmation consent, patient brought to the operating room placed on table in supine position.  After a time-out confirming correct patient correct site correct procedure, anesthesia was induced and maintained.  Was transitioned to right lateral decubitus position with the left side up.  Bump was placed behind his back.  The left arm was placed across the chest in an [employer].  The table was flexed.  All pressure points were appropriately padded and he was secured to the table using safety strap, blue towels and thick silk tape.  Abdomen was then prepped and draped in usual sterile fashion for laparoscopic approach nephrectomy.  Stab incision was created in the left midclavicular line Veress needle was passed into the abdomen.  Water drop test confirmed placement.  Insufflation took place to 15 millimeters of mercury.  The 8 millimeter robotic trocar was then inserted and the camera was inserted behind it.  There was some insufflation of the small bowel mesentery but true intraperitoneal space was able to be identified and developed.  Three additional 8 millimeter trocars were placed in the midclavicular line approximately 4 fingerbreadths apart.  And the AirSeal port was then replaced beneath the umbilicus.  The robot was then brought into the surgical field and docked.  We began by taking down the left colon along the white line and developing a plane between the colonic mesentery and the retroperitoneal surface.  The lower pole of the perinephric fat was identified.  The ureter and gonadal vein were visualized and left untouched.  The kidney was then completely defatted.  Notably there was significant perinephric inflammation and the perinephric fat was quite sticky.  Along the lateral aspect approximately interpolar was the cystic multiloculated mass seen on MRI.  There was a 2nd very small cyst in the upper pole which appeared simple and was cauterized and decorticated.  Once the kidney was appropriately mobilized to allow positioning of the tumor anterior, the hilum was dissected out and the artery was identified.  It was circumferentially mobilized.  The lock stitch was then brought into the abdomen.  Intraoperative ultrasound was then performed which allowed delineation of the borders of the mass.  It did not go very deep into the renal parenchyma.  The bulldog clamp was then brought into the abdomen placed across the renal artery.  The mass was then dissected off the renal parenchyma circumferentially.  There was no violation of the cystic mass.  There appeared to be good margins.  The base of the tumor bed was then cauterized using  the [employer].  Once the mass was freed it was placed in the left lower quadrant.  The base of the tumor was then run using a 3-0 V lock stitch at the cortical medullary junction.  Weck clips were used to secure it at either end.  To 0 V lock stitches were then used to close the defect.  The bolster was placed at the base of the tumor.  All stitches were tensioned.  The bulldog clamp was then removed.  Warm ischemia time was approximately 15 minutes.  There was no bleeding from the tumor site.  All needles were then removed from the abdomen.  The kidney was returned to its anatomic position.  Surgiflo and an additional sheet of Surgicel was placed over the tumor bed.  Gerota fascia was then closed with a running 2-0 V lock stitch.  A clip was placed at either end.  The tumor was placed into an Endo-Catch bag.  Sixteen Blake drain was placed through the lowest lateral 8 millimeter port site and secured to the skin.  It was placed up alongside of the kidney within Gerota fascia to help detect any possible urine leak.  All instruments were then removed.  The robotic ports were removed under vision no bleeding was seen from the port sites.  The AirSeal site was then removed slightly extended to allow atraumatic extraction of the tumor.  The infraumbilical site was yes with a series of figure-of-eight Vicryl stitches at the fascia.  Subcutaneous fat was reapproximated using 3-0 Vicryl stitch.  All skin incisions were then closed with 4-0 Monocryl subcuticular stitch and dressed with Dermabond.  Patient was then awakened from anesthesia having tolerated the procedure well.  I was present scrubbed for the entirety of the case and interpreted all intraoperative images.Surgical PA was required to assist with this case with wound creation and closure, retraction, bedside assistance with the robot.Estimated Blood Loss:  50 CC Blood and Blood Products: none                 Implants: * No implants in log *?Drains:  16 Blake drain, 16 French urethral catheterSpecimen:  Left renal mass to pathology?Complications:  None Disposition:  To PACU then floor Yisroel Ramming, MD 09/25/2019 5:09 PMAssistant Professor of UrologyYale School of [facility] [address]Second Floor, [address], [place] [phone]
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
## 2019-09-25 NOTE — Other
Post Anesthesia Transfer of Care NotePatient: Philip Bee MarcksProcedure(s) Performed: Procedure(s) (LRB):ERAS ROBOTIC ASSISTED LEFT LAPAROSCOPIC PARTIAL NEPHRECTOMY (Left) Patient location: PACU Last Vitals: Vitals Value Taken Time BP 145/96 09/25/19 1734 Temp 36.7 ?C 09/25/19 1734 Pulse 84 09/25/19 1735 Resp 16 09/25/19 1735 SpO2 99 % 09/25/19 1735 Vitals shown include unvalidated device data.Level of consciousness: awake, alert  and orientedTransport Vital Signs:  Stable since the last set of recorded intra-operative vital signsComplications: noneIntra-operative Intake & Output and Antibiotics as per Anesthesia record and discussed with the RN.

## 2019-09-25 NOTE — Other
Operative Diagnosis:Pre-op:   * No pre-op diagnosis entered * Patient Coded Diagnosis   Pre-op diagnosis: Left kidney mass  Post-op diagnosis: Left kidney mass  Patient Diagnosis   None    Post-op diagnosis:   * Left kidney mass [N28.89]Operative Procedure(s) :Procedure(s) (LRB):ERAS ROBOTIC ASSISTED LEFT LAPAROSCOPIC PARTIAL NEPHRECTOMY (Left)Post-op Procedure & Diagnosis ConfirmationPost-op Diagnosis: Post-op Diagnosis confirmed (no changes)Post-op Procedure: Post-op Procedure confirmed (no changes)

## 2019-09-25 NOTE — Brief Op Note
[facility]Patient Name: Philip [name]        [id number] Patient DOB: Jul 04, 1960     Surgery Date: 8/30/2021Surgeon(s) and Role:   * Brito, Danelle Earthly, MD - PrimaryAssistant(s):PA 1st Assist: Lavon Paganini, [place], [place]; [place], [place], PA-CSurgical PA was present for the entire case and assisted with exposure, retraction, hemostasis, dissection and wound closure. Staff:  Circulator: Drucilla Schmidt, RN; Christy Gentles, RNRelief Circulator: Wilma Flavin, RNRelief Scrub: Sweet, AnnmarieScrub Person: Jamey Reas KPre-Op Diagnosis: :Left renal mass  Procedure(s) and Anesthesia Type:   * ERAS ROBOTIC ASSISTED LEFT LAPAROSCOPIC PARTIAL NEPHRECTOMY - GENERALOperative Findings (enter relevant operative findings; do not refer to an operative report that is not yet transcribed): see MD dictationSigns of infection present at the time of surgery at the operative site: None Blood and Blood Products: none                 Drains:  noneImplants: * No implants in log * Specimens: ID Type Source Tests Collected by Time 1 : left renal mass Tissue Soft Tissue, Other PATHOLOGY ([facility]) [name], MD 09/25/2019  5:05 PM  Clinical Staging: n/aEBL: 50 mL       Post Operative Diagnosis: Left renal mass  [name], PA-C8/30/20215:33 PM

## 2019-09-26 ENCOUNTER — Telehealth: Admit: 2019-09-26 | Payer: PRIVATE HEALTH INSURANCE | Attending: Urology

## 2019-09-26 DIAGNOSIS — I10 Essential (primary) hypertension: Secondary | ICD-10-CM

## 2019-09-26 DIAGNOSIS — Z79899 Other long term (current) drug therapy: Secondary | ICD-10-CM

## 2019-09-26 DIAGNOSIS — Z6831 Body mass index (BMI) 31.0-31.9, adult: Secondary | ICD-10-CM

## 2019-09-26 DIAGNOSIS — Z8673 Personal history of transient ischemic attack (TIA), and cerebral infarction without residual deficits: Secondary | ICD-10-CM

## 2019-09-26 DIAGNOSIS — Z8774 Personal history of (corrected) congenital malformations of heart and circulatory system: Secondary | ICD-10-CM

## 2019-09-26 DIAGNOSIS — Z7982 Long term (current) use of aspirin: Secondary | ICD-10-CM

## 2019-09-26 DIAGNOSIS — E669 Obesity, unspecified: Secondary | ICD-10-CM

## 2019-09-26 DIAGNOSIS — Z8249 Family history of ischemic heart disease and other diseases of the circulatory system: Secondary | ICD-10-CM

## 2019-09-26 DIAGNOSIS — K219 Gastro-esophageal reflux disease without esophagitis: Secondary | ICD-10-CM

## 2019-09-26 DIAGNOSIS — N281 Cyst of kidney, acquired: Secondary | ICD-10-CM

## 2019-09-26 DIAGNOSIS — E7800 Pure hypercholesterolemia, unspecified: Secondary | ICD-10-CM

## 2019-09-26 DIAGNOSIS — Z85831 Personal history of malignant neoplasm of soft tissue: Secondary | ICD-10-CM

## 2019-09-26 DIAGNOSIS — Z880 Allergy status to penicillin: Secondary | ICD-10-CM

## 2019-09-26 DIAGNOSIS — G629 Polyneuropathy, unspecified: Secondary | ICD-10-CM

## 2019-09-26 DIAGNOSIS — Z8051 Family history of malignant neoplasm of kidney: Secondary | ICD-10-CM

## 2019-09-26 DIAGNOSIS — D4102 Neoplasm of uncertain behavior of left kidney: Secondary | ICD-10-CM

## 2019-09-26 LAB — BASIC METABOLIC PANEL
BKR ANION GAP (LM): 4 mmol/L — ABNORMAL LOW (ref 5–15)
BKR BLOOD UREA NITROGEN: 15 mg/dL (ref 7–18)
BKR CALCIUM: 8.4 mg/dL — ABNORMAL LOW (ref 8.5–10.1)
BKR CHLORIDE: 105 mmol/L (ref 98–107)
BKR CO2: 26 mmol/L (ref 21–32)
BKR CREATININE: 1 mg/dL (ref 0.70–1.30)
BKR EGFR (AFR AMER) (LMC): >60 mL/min/{1.73_m2} (ref >60)
BKR EGFR (NON AFR AMER) (LMC): >60 mL/min/{1.73_m2} (ref >60)
BKR GLUCOSE: 134 mg/dL — ABNORMAL HIGH (ref 65–110)
BKR POTASSIUM: 4.2 mmol/L (ref 3.5–5.1)
BKR SODIUM: 135 mmol/L — ABNORMAL LOW (ref 136–145)

## 2019-09-26 LAB — ZZZHEMATOCRIT: BKR WAM HEMATOCRIT: 39.8 % — ABNORMAL LOW (ref 40.0–50.0)

## 2019-09-26 LAB — CREATININE, BODY FLUID     (BH GH LMW YH): BKR CREATININE FLUID: 1 mg/dL

## 2019-09-26 LAB — HEMOGLOBIN     (BH GH L LMW Q): BKR WAM HEMOGLOBIN: 13.6 g/dL (ref 13.0–17.0)

## 2019-09-26 MED ORDER — DOCUSATE SODIUM 100 MG CAPSULE
100 mg | ORAL_CAPSULE | Freq: Every day | ORAL | 1 refills | Status: DC
Start: 2019-09-26 — End: 2020-02-16

## 2019-09-26 MED ORDER — OXYCODONE IMMEDIATE RELEASE 5 MG TABLET
5 mg | ORAL_TABLET | ORAL | 1 refills | Status: AC | PRN
Start: 2019-09-26 — End: ?

## 2019-09-26 NOTE — Telephone Encounter
Patient needs a follow-up appointment in 2 weeks with me or Floreen Comber for pathology review.

## 2019-09-26 NOTE — Utilization Review (ED)
UM Status: Commercial - IP  * ERAS ROBOTIC ASSISTED LEFT LAPAROSCOPIC PARTIAL NEPHRECTOMY - GENERAL  With ip order

## 2019-09-26 NOTE — Anesthesia Post-Procedure Evaluation
Anesthesia Post-op NotePatient: Philip Bee MarcksProcedure(s):  Procedure(s) (LRB):ERAS ROBOTIC ASSISTED LEFT LAPAROSCOPIC PARTIAL NEPHRECTOMY (Left) Patient location: PACULast Vitals:  I have noted the vital signs as listed in the nursing notes.Mental status recovered: patient participates in evaluation: YesVital signs reviewed: YesRespiratory function stable:YesAirway is patent: YesCardiovascular function and hydration status stable: YesPain control satisfactory: YesNausea and vomiting control satisfactory:Yes

## 2019-09-26 NOTE — Progress Notes
Surgical Drain Removal Procedure NoteSurgical drain removal requested by MD.  Procedure was explained to the patient. Patient is in agreement and gave consent for drain removal.Skin stitch was removed.  JP drain was removed without complication.  Patient tolerated the procedure well.  There was minimal drainage from the drain site.  A dry sterile dressing with tegaderm was placed over the wound.Signed: Dwyane Dee, PA-C8/31/202111:51 AM

## 2019-09-26 NOTE — Plan of Care
Plan of Care Overview/ Patient Status    Ambulating in hall, due to void thenD/C home

## 2019-09-26 NOTE — Plan of Care
Plan of Care Overview/ Patient Status    Pt a & p x 4. POD #1 ERAS robotic assisted lap partial nephrectomy. Lap sites OTA. Nephrectomy dressing intact w/ scant serosanguinous dressing. Drain output 40 mL of sanguineous fluid overnight. Foley draining yellow urine. IS use encouraged. Pain adequately managed w/ tylenol. 2L NC overnight, removed this AM and tolerating well. Safety maintained, call light within reach.

## 2019-09-26 NOTE — Plan of Care
Plan of Care Overview/ Patient Status    Voided 250cc clear yellow , D/C home, discharge instructions given and patient verbalized understanding

## 2019-09-26 NOTE — Plan of Care
Plan of Care Overview/ Patient Status    Problem: Adult Inpatient Plan of CareGoal: Readiness for Transition of CareOutcome: Interventions implemented as appropriate Pt is POD# 1 S/P robot assisted laparoscopic partial left nephrectomy. Drain to be removed prior to discharge. Case Management Assessment  Pt lives at home with wife. Independent in all aspects of care, no use of assistive devices. No CM needs anticipated.Wife to transport on d/c.   Most Recent Value Case Management Assessment Do you have a caregiver? No Patient Requires Care Coordination Intervention Due To discharge planning needs/concerns, change in physical function Arrived from prior to admission home Bed Hold  no Services Prior to Admission none ADL Assistance None Type of Home Care Services None Equipment Currently Used at Home none Documented Insurance Accurate Yes Any financial concerns related to anticipated discharge needs No Patient's home address verified Yes Patient's PCP of record verified Yes Last Date Seen by PCP 0-3 months Living Environment  Lives With Spouse Current Living Arrangements home/apartment/condo Home Accessibility bed and bath on same level, stairs within home, bed and bath are not on the first floor, stairs to enter home Number of Stairs, Main Entrance seven Stair Railings, Main Entrance railings safe and in good condition Transportation Available family or friend will provide Home Safety Feels Safe Living In Home yes Source of Clinical History Patient's clinical history has been reviewed and source of Information is: Patient, Medical record Completed Assessment Completed Initial Assesment by: Name Philip [name] C Role: Transition Coordinator CM/SW Attestation: Choose which ONE is appropriate for you I have reviewed the medical record and agree with the above evaluation by the transition coordinator/discharge planning coordinator with the following recommendations. Yes Discharge Planning Coordination Recommendations Discharge Planning Coordination Recommendations Home with MD follow-up/No needs identified RN reviewed plan of care/ continuum of care need's with  Interdisciplinary Team  CM Discharge Planning    Most Recent Value Discharge Planning Assessment of the spouse/caregiver's ability and willingness to provide care for the patient lives with spouse Concerns to be Addressed no discharge needs identified, denies needs/concerns at this time Patient/Patient Representative was presented with a list of choices of facilities, agencies and/or dme providers They had no preference Referral(s) placed for: None Patient goals/treatment preference for discharge are:  d/c home with MD follow up Patient is considered homebound due to: (he/she requires considerable and taxing effort to leave their residence for medical reasons or religious services OR infrequently OR of short duration for other reasons) n/a Equipment Needed After Discharge none Mode of Transportation  Private car  (add comment for special considerations) Patient to be accompanied by spouse Is the patient discharging to their home address Yes CM D/C Readiness PASRR completed and approved N/A Authorization number obtained, if required N/A Is there a 3 day INPATIENT Qualifying stay for Medicare Patients? N/A Medicare IM- signed, dated, timed and scanned, if required N/A DME Authorized/Delivered N/A No needs identified/ follow up with PCP/MD Yes Post acute care services secured W10 complete N/A Pri Completed and Accepted  N/A Finalized Plan Expected Discharge Date 09/26/19 Discharge Disposition Home or Self Care Post acute care services secured W10 complete N/A Physician documentation required AVS (Patient Instructions), Prescriptions Discharge Coordination/Progress d/c home with MD follow up, wife transport  Philip Norlander RN, BSNNurse Case Manager, Per [email or web address]

## 2019-09-26 NOTE — Other
PACU to Floor Nursing Transfer NotePreop Diagnosis: mass on L kidneyProcedure Done: L partial nephrectomyAny Significant Events Intra-Op: frequent PVCs and less occasional trigeminy on monitor (which pt has a previous history of)Abnormal Assessment in PACU: PVCs and occ. Trigeminy on monitorLevel of Consciousness: lethargic; alert to verbal and opens eyes independently.Last Set of VS:  Vitals:  09/25/19 1840 09/25/19 1855 09/25/19 1910 09/25/19 1925 BP: 129/84 109/61 111/63 119/60 Pulse: 74 77 77 74 Resp: (!) 13 15 18  (!) 10 Temp:     TempSrc:     SpO2: 95% 96% 97% 96% Weight:     Device (Oxygen Therapy): nasal cannula O2 Flow (L/min): 2Baseline Neuro/developmental Status: A&Ox4Labs Collected: none in pacuSpecial Needs of the Patient: none in pacuAntibiotics: received intra-opIV Access: Periph IV - double lumen (Adult,OB) 09/25/19 1114 cephalic (thumb side), right double-lumen over-the-needle catheter system 20 gauge (Active) SiteCare/Dressing Status/Securement dressing dry and intact 09/25/19 1730 Lumen 1 Patency/Care Patent 09/25/19 1114 Site Assessment asymptomatic with no redness, no swelling, no drainage 09/25/19 1730 Phlebitis 0-->no symptoms 09/25/19 1730 Infiltration/Extravasation Assessment 0-->no symptoms 09/25/19 1730   Periph IV - double lumen (Adult,OB) 09/25/19 1116 cephalic (thumb side), left double-lumen over-the-needle catheter system 18 gauge (Active) SiteCare/Dressing Status/Securement dressing dry and intact 09/25/19 1730 Lumen 1 Patency/Care Patent;flushed w/o difficulty 09/25/19 1730 Lumen 2 Patency/Care Patent 09/25/19 1116 Site Assessment asymptomatic with no redness, no swelling, no drainage 09/25/19 1730 Phlebitis 0-->no symptoms 09/25/19 1730 Infiltration/Extravasation Assessment 0-->no symptoms 09/25/19 1730 IV Fluids: ? lactated Ringers 75 mL/hr (09/25/19 1403) ? lactated Ringers   ? lactated Ringers 75 mL/hr (09/25/19 1748) Pain Assessment: Number Scale (0-10) 09/25/19 1043 Left flank-Pain Rating (0-10): Rest: 3Pain Assessment: Number Scale (0-10) 09/25/19 1043 Left flank-Pain Rating (0-10): Activity: 4   Wound 09/25/19 1710 Incision abdomen-Incision Closure : unable to assess Drain/Device Site Left lower quadrant other (see comments)-Drainage Amount: scantBody Position: supineHead of Bed (HOB) Positioning: HOB at 20 degreesDrain/Device Site Left lower quadrant other (see comments)-Insertion Site: clean and dry, dressing intact  Time of Last Void or Time that Urinary Catheter was Removed Intra-Op: foley in placeAdditional Info: pt has 2 tubes own facial cream in his pillow from home with MD permission per wife (ketoconazole and triamcinolone)Contact RN (name and phone number): Fleet Contras [phone] Pt had some brief nausea, dry heaving after eating a sandwich. Remedicated with Compazine with good relief and medicated for pain. Pt opting to hold on oxycodone for now as it makes him nauseated, gave Toradol 15 mg IV and po Tylenol with some effects. 2100Eval by Ezequiel Essex PA who was aware pt put out 200 ml urine in 3.5 hours in pacu. Pt bladder scanned in case and no urine was noted. Foley is draining to gravity. Will monitor.

## 2019-09-26 NOTE — Discharge Instructions
Surgery Discharge Instructions Diet- You can eat a regular diet, however, it is not uncommon that your appetite be reduced after receiving general anesthesiaDischarge Medications- Pain Medication: You are being prescribed Oxycodone (Roxicodone) 5mg  Immediate Release tablet. You can take 1-2 tabs, every 4-6 hours as needed for severe pain. This is a narcotic. If your pain is not too severe it is okay to take Tylenol (Acetaminophen) or Motrin (Ibuprofen).- Stool Softener: Colace (Docusate) 100 mg, 1 capsule, 2x day- Narcotics can cause constipation; we encourage the use of stool softeners (ie. Colace, Ducolax, Sennokot) while on narcotic pain medicaitons.  If your narcotic prescription also contains Tylenol (Acetaminophen) DO NOT TAKE THE TWO TOGETHER.  DO NOT TAKE more than 4,000 mg of acetaminophen in a day. Absolutely no driving heavy machinery while on narcotic pain medication.Activity - No heavy lifting, pushing, or pulling until advised by Dr. Genice Rouge.- Do not lift greater than 15lbs for 4 weeks if you had laparoscopic surgery.-We encourage you to ambulate and carry out your daily living activities.- Daily walking is recommended after surgery. You should walk a maximum of 1 mile a day.- May walk up and down stairs, one step at a time.- No push ups or sit ups.- Be out of bed as much as you can.- You should refrain from driving for about 9-14 days after surgery. - Continue to use your Incentive Spirometer (breathing device) after discharge to help prevent pneumonia. Hydration- Hydration following surgery is very important.- You should drink at least 1-2 liters of fluid a day. Avoid alcohol and soda.Incision/Wound Care- Always wash your hands for 30 seconds before and after touching any incision.- You may shower but do not submerge your wounds in water (baths, hot tubs, pools, ocean, etc).- No bath tubs, hot tubs, ocean, pools, or swimming until advised by MD.- Shower with antimicrobial soap, allowing the soap and water to run over your wounds. Gently pat the wounds dry after. You do not need to cover your incisions. Do not scrub your wounds. - You have surgical glue over your incisions. Do not pick or peel this off. It will dissolve on its own over time. It is water proof.- The dressing over the drain site may be removed on 09/28/2019 and then changed as needed after that- If any incision begins draining a lot, develops a foul odor, becomes more tender or becomes reddened, please call the doctor?s office.Constipation Prevention- A prescription for Docusate (Colace) capsules is given at discharge. This medication will soften the stool making it easier to have a bowel movement. This medication should be taken while narcotic painmedication is used.- If no bowel movement within 3 days following surgery, Miralax may be purchased over the counter and taken.Travel- No driving while taking narcotic pain medications- Please discuss any plans for travel with your surgeonCall the doctor?s office and report any of the following:- Bleeding occurs or continues- Chest pain- Shortness of breath- Increased or new abdominal pain- Feeling your heart is racing- Incision opens up- Incision redness, warmth, swelling, bleeding, drainage- Lightheadedness, dizziness or fainting- Persistent nausea, vomiting- New or increased fatigue or weakness- Temperature greater than 101 degrees F for more than 24 hours- Shaking chills- Pain unrelieved by pain medicationsPlease, call Dr. Selinda Flavin office to schedule an appointment within 7-10 days of discharge. If you have any concerns at all, please do not hesitate to call the office. We wish you well in your recovery.

## 2019-09-26 NOTE — Plan of Care
Plan of Care Overview/ Patient Status    Foley d/cd @ 930, ambulated in hall, breakfast tolerated, med with Toradol for pain #3
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
## 2019-09-26 NOTE — Other
Post op checkS:  Patient complains of incisional pain.  He had some nausea immediately postop, but has tolerated his dinner without nausea or vomiting.  Drain recently emptied for 40 cubic centimeters. Urine output 200 cc.O: Vitals:  09/25/19 2100 BP: (!) 156/88 Pulse: 78 Resp: 14 Temp: 98.2 ?F (36.8 ?C) General:  Alert and oriented, no acute distressPulmonary:  Clear to auscultation bilaterallyCardiovascular  Regular rate and rhythm, normal S1-S2Abdomen:  Soft, nondistended, nontender to light palpation, drain in place with scant sanguinous drainageExtremities:  SCDs in placeAssessment/plan:  Patient is a 59 year old male postop day number 0 status post robot left partial nephrectomy-pain control antiemetics as needed-monitor urine and drain output-Foley catheter to gravity-check labs in a.m.-drain creatinine in a.m.Keanna Tugwell, PA-C

## 2019-09-26 NOTE — Progress Notes
[facility]     Urology Progress Note    Attending Provider: [name]*    Post-operative Day:     POD# 1 S/P robot assisted laparoscopic partial left nephrectomy  Hospital LOS: 1 day     Interim History:     No acute events overnight. Patient feeling okay this AM. Reports expected post-operative soreness.    Denies: N/V, fever, chills    Physical Exam:     Vitals:   Temp:  [96.8 ?F (36 ?C)-98.2 ?F (36.8 ?C)] 97.7 ?F (36.5 ?C)  Pulse:  [74-95] 83  Resp:  [10-27] 16  BP: (109-156)/(60-99) 131/79  SpO2:  [94 %-100 %] 94 %  Device (Oxygen Therapy): room air  O2 Flow (L/min):  [2-4] 2  Wt:   09/25/19 103.8 kg   08/15/19 95.3 kg   07/28/19 95.4 kg       I/O:    Gross Totals (Last 24 hours) at 09/26/2019 0804  Last data filed at 09/26/2019 1610  Intake 3550 ml   Output 1530 ml   Net 2020 ml     Urine Output: 700cc/overnight  Drain Output: 40cc/40cc total of 80cc/12 hours    Physical Exam:  General: Alert and oriented, resting comfortably. No acute distress.  Heart: RRR, S1S2 present  Lungs: Clear breath sounds bilaterally. No wheeze, rhonchi, or rales  Abdomen: soft, nondistended. Expected incisional tenderness. Incisions with glue in place, open to air. Drain in place with scant serosanguinous fluid  GU: Foley catheter in place draining clear yellow urine. Examination chaperoned by Donnal Debar, MD.  Ext: SCDs in place, no edema  Skin: Warm, dry throughout, no rash  Neuro: No focal deficits    Review of Labs/Diagnostics:     Lab Review:  Recent Labs   Lab 09/19/19  0949   WBC 4.78   HGB 14.9   HCT 44.6   PLT 253   MCV 91.2     Recent Labs   Lab 09/19/19  0949   NA 138   K 4.2   CL 106   CO2 28   BUN 13   CREATININE 0.99     No results for input(s): INR, PROTIME, AMMONIA in the last 168 hours.  Recent Labs   Lab 09/19/19  0949   PROT 7.6   ALBUMIN 4.0   BILITOT 0.6   AST 23   ALT 43   ALKPHOS 68       Urine Culture:   Results for orders placed or performed during the hospital encounter of 09/19/19   Urine culture - Lab Collect    Collection Time: 09/19/19  9:49 AM    Specimen: Urine, Clean Catch; Culture   Result Value    Urine Culture, Routine No Growth     Blood Culture: No results found for this or any previous visit.      Diagnostics:  No new imaging studies    Assessment:   Patient is a 86 yom POD#1 s/p robot assisted laparoscopic partial left nephrectomy for left renal mass. Patient is doing well post-operatively. Having expected abdominal soreness. Drain creatinine and AM labs are pending. Still needs to ambulate and eat, but anticipate that patient will be able to discharge later today pending clinical course.    Plan:   -D/C foley catheter  -Obtain drain creatinine  -Out of bed/ambulate  -Diet: regular  -pain control prn  -Antiemetic prn  -PPI/antacid  -DVT/PE Ppx:  SQ Heparin ppx, SCDs  -ISS/pulmonary toilet  -Pending clinical course this morning, patient will likely be able to discharge home today  -Surgical drain will be removed prior to discharge    Discussed with Dr. Genice Rouge, he is agreement with the above plan, any further recommendations pending his assessment of the patient.    Signed:   Dwyane Dee, PA-C  09/26/2019  7:53 AM

## 2019-09-29 ENCOUNTER — Encounter: Admit: 2019-09-29 | Payer: PRIVATE HEALTH INSURANCE

## 2019-09-29 ENCOUNTER — Emergency Department: Admit: 2019-09-29 | Payer: PRIVATE HEALTH INSURANCE

## 2019-09-29 ENCOUNTER — Telehealth: Admit: 2019-09-29 | Payer: PRIVATE HEALTH INSURANCE | Attending: Urology

## 2019-09-29 ENCOUNTER — Encounter: Admit: 2019-09-29 | Payer: PRIVATE HEALTH INSURANCE | Attending: Cardiovascular Disease

## 2019-09-29 ENCOUNTER — Inpatient Hospital Stay: Admit: 2019-09-29 | Discharge: 2019-09-30 | Payer: BLUE CROSS/BLUE SHIELD

## 2019-09-29 ENCOUNTER — Inpatient Hospital Stay: Admit: 2019-09-29 | Discharge: 2019-09-29 | Disposition: A | Discharge status: Home / Self Care | Payer: Self-pay

## 2019-09-29 DIAGNOSIS — C801 Malignant (primary) neoplasm, unspecified: Secondary | ICD-10-CM

## 2019-09-29 DIAGNOSIS — R079 Chest pain, unspecified: Principal | ICD-10-CM

## 2019-09-29 DIAGNOSIS — E7800 Pure hypercholesterolemia, unspecified: Secondary | ICD-10-CM

## 2019-09-29 DIAGNOSIS — K219 Gastro-esophageal reflux disease without esophagitis: Secondary | ICD-10-CM

## 2019-09-29 DIAGNOSIS — I639 Cerebral infarction, unspecified: Secondary | ICD-10-CM

## 2019-09-29 DIAGNOSIS — C499 Malignant neoplasm of connective and soft tissue, unspecified: Secondary | ICD-10-CM

## 2019-09-29 DIAGNOSIS — I10 Essential (primary) hypertension: Secondary | ICD-10-CM

## 2019-09-29 DIAGNOSIS — I499 Cardiac arrhythmia, unspecified: Secondary | ICD-10-CM

## 2019-09-29 DIAGNOSIS — Q2112 PFO (patent foramen ovale): Secondary | ICD-10-CM

## 2019-09-29 LAB — CBC WITH AUTO DIFFERENTIAL
BKR WAM ABSOLUTE IMMATURE GRANULOCYTES.: 0.01 x 1000/??L (ref 0.00–0.10)
BKR WAM ABSOLUTE LYMPHOCYTE COUNT.: 1.75 x 1000/??L (ref 1.00–4.50)
BKR WAM ABSOLUTE NEUTROPHIL COUNT.: 3.1 x 1000/??L (ref 1.40–6.60)
BKR WAM BASOPHIL ABSOLUTE COUNT.: 0.01 x 1000/??L (ref 0.0–0.3)
BKR WAM BASOPHILS: 0.2 % (ref 0.0–3.0)
BKR WAM EOSINOPHIL ABSOLUTE COUNT.: 0.19 x 1000/??L (ref 0.00–0.45)
BKR WAM EOSINOPHILS: 3.4 % (ref 0.0–4.0)
BKR WAM HEMATOCRIT: 40.6 % (ref 40.0–50.0)
BKR WAM HEMOGLOBIN: 13.5 g/dL (ref 13.0–17.0)
BKR WAM IMMATURE GRANULOCYTES: 0.2 % (ref 0.0–1.0)
BKR WAM LYMPHOCYTES: 31.6 % (ref 25.0–45.0)
BKR WAM MCH PG: 30.8 pg (ref 27–33)
BKR WAM MCHC: 33.3 g/dL (ref 32.0–36.0)
BKR WAM MCV: 92.5 fL (ref 79.0–99.0)
BKR WAM MONOCYTE ABSOLUTE COUNT.: 0.48 x 1000/??L (ref 0.00–1.20)
BKR WAM MONOCYTES: 8.7 % (ref 0.0–12.0)
BKR WAM MPV: 9.9 fL (ref 7.5–11.5)
BKR WAM NEUTROPHILS: 55.9 % (ref 36.0–66.0)
BKR WAM NUCLEATED RED BLOOD CELLS: 0 % (ref 0.0–5.0)
BKR WAM PLATELETS: 221 x1000/??L (ref 140–400)
BKR WAM RDW-CV: 13.1 % (ref 11.5–14.5)
BKR WAM RED BLOOD CELL COUNT.: 4.39 M/??L (ref 4.00–5.20)
BKR WAM WHITE BLOOD CELL COUNT.: 5.54 x1000/ÂµL (ref 4.00–10.00)

## 2019-09-29 LAB — BASIC METABOLIC PANEL
BKR ANION GAP (LM): 8 mmol/L (ref 5–15)
BKR BLOOD UREA NITROGEN: 18 mg/dL (ref 7–18)
BKR CALCIUM: 8.5 mg/dL (ref 8.5–10.1)
BKR CHLORIDE: 105 mmol/L (ref 98–107)
BKR CO2: 24 mmol/L (ref 21–32)
BKR CREATININE: 0.96 mg/dL (ref 0.70–1.30)
BKR EGFR (AFR AMER) (LMC): >60 mL/min/{1.73_m2} (ref >60)
BKR EGFR (NON AFR AMER) (LMC): >60 mL/min/{1.73_m2} (ref >60)
BKR GLUCOSE: 91 mg/dL (ref 65–110)
BKR POTASSIUM: 3.5 mmol/L (ref 3.5–5.1)
BKR SODIUM: 137 mmol/L (ref 136–145)

## 2019-09-29 LAB — D-DIMER, QUANTITATIVE: BKR D-DIMER: 1.63 mg/L FEU — ABNORMAL HIGH (ref <0.50)

## 2019-09-29 LAB — NT-PROBNPE: BKR B-TYPE NATRIURETIC PEPTIDE, PRO (PROBNP): 161 pg/mL — ABNORMAL HIGH (ref <125)

## 2019-09-29 LAB — SARS COV-2 (COVID-19) RNA-~~LOC~~ LABS (BH GH LMW YH): BKR SARS-COV-2 RNA (COVID-19) (YH): NEGATIVE

## 2019-09-29 MED ORDER — SODIUM CHLORIDE 0.9 % BOLUS (NEW BAG)
0.9 % | Freq: Once | INTRAVENOUS | Status: CP
Start: 2019-09-29 — End: ?
  Administered 2019-09-29: 0.9 mL/h via INTRAVENOUS

## 2019-09-29 MED ORDER — DIATRIZOATE MEGLUMINE-DIATRIZOATE SODIUM 66 %-10 % ORAL SOLUTION
66-10 % | Freq: Once | ORAL | Status: CP | PRN
Start: 2019-09-29 — End: ?
  Administered 2019-09-30: 02:00:00 66-10 mL via ORAL

## 2019-09-29 MED ORDER — IOHEXOL 350 MG IODINE/ML INTRAVENOUS SOLUTION
350 mg iodine/mL | Freq: Once | INTRAVENOUS | Status: CP | PRN
Start: 2019-09-29 — End: ?
  Administered 2019-09-29: 350 mL via INTRAVENOUS

## 2019-09-29 MED ORDER — ASPIRIN 81 MG CHEWABLE TABLET
81 mg | Freq: Once | ORAL | Status: CP
Start: 2019-09-29 — End: ?
  Administered 2019-09-29: 23:00:00 81 mg via ORAL

## 2019-09-29 MED ORDER — SODIUM CHLORIDE 0.9 % (FLUSH) INJECTION SYRINGE
0.9 % | INTRAVENOUS | Status: DC | PRN
Start: 2019-09-29 — End: 2019-09-30

## 2019-09-29 MED ORDER — IOHEXOL 240 MG IODINE/ML INTRAVENOUS SOLUTION
240 mg iodine/mL | Freq: Once | INTRA_ARTERIAL | Status: DC | PRN
Start: 2019-09-29 — End: 2019-09-30

## 2019-09-29 NOTE — Telephone Encounter
Spoke with Philip [name] and wife woh report SOB, heart palpitations x 2 hours. DENIES weakness, dizziness, bleeding, pain. Wife bringing to [facility]. Answering service notified.Surgery Monday 09/26/2019:ERAS ROBOTIC ASSISTED LEFT LAPAROSCOPIC PARTIAL NEPHRECTOMY

## 2019-09-29 NOTE — Telephone Encounter
Dr.Greene would like to speak to Dr.Brito he did not need to speak to him at the moment he would like a call back when Dr.Brito gets a chance please call him at [phone] thank you

## 2019-09-30 ENCOUNTER — Encounter: Admit: 2019-09-30 | Payer: PRIVATE HEALTH INSURANCE | Attending: Internal Medicine

## 2019-09-30 DIAGNOSIS — Z905 Acquired absence of kidney: Secondary | ICD-10-CM

## 2019-09-30 DIAGNOSIS — Z8673 Personal history of transient ischemic attack (TIA), and cerebral infarction without residual deficits: Secondary | ICD-10-CM

## 2019-09-30 DIAGNOSIS — Z79899 Other long term (current) drug therapy: Secondary | ICD-10-CM

## 2019-09-30 DIAGNOSIS — E785 Hyperlipidemia, unspecified: Secondary | ICD-10-CM

## 2019-09-30 DIAGNOSIS — S37012A Minor contusion of left kidney, initial encounter: Secondary | ICD-10-CM

## 2019-09-30 DIAGNOSIS — I639 Cerebral infarction, unspecified: Secondary | ICD-10-CM

## 2019-09-30 DIAGNOSIS — Z7982 Long term (current) use of aspirin: Secondary | ICD-10-CM

## 2019-09-30 DIAGNOSIS — I10 Essential (primary) hypertension: Secondary | ICD-10-CM

## 2019-09-30 DIAGNOSIS — E7800 Pure hypercholesterolemia, unspecified: Secondary | ICD-10-CM

## 2019-09-30 DIAGNOSIS — Z880 Allergy status to penicillin: Secondary | ICD-10-CM

## 2019-09-30 DIAGNOSIS — C801 Malignant (primary) neoplasm, unspecified: Secondary | ICD-10-CM

## 2019-09-30 DIAGNOSIS — K219 Gastro-esophageal reflux disease without esophagitis: Secondary | ICD-10-CM

## 2019-09-30 DIAGNOSIS — R0789 Other chest pain: Secondary | ICD-10-CM

## 2019-09-30 DIAGNOSIS — J982 Interstitial emphysema: Secondary | ICD-10-CM

## 2019-09-30 DIAGNOSIS — Q2112 PFO (patent foramen ovale): Secondary | ICD-10-CM

## 2019-09-30 DIAGNOSIS — C499 Malignant neoplasm of connective and soft tissue, unspecified: Secondary | ICD-10-CM

## 2019-09-30 DIAGNOSIS — I499 Cardiac arrhythmia, unspecified: Secondary | ICD-10-CM

## 2019-09-30 LAB — CBC WITH AUTO DIFFERENTIAL
BKR WAM ABSOLUTE IMMATURE GRANULOCYTES.: 0.02 x 1000/ÂµL (ref 0.00–0.10)
BKR WAM ABSOLUTE LYMPHOCYTE COUNT.: 2.05 x 1000/ÂµL (ref 1.00–4.50)
BKR WAM ABSOLUTE NEUTROPHIL COUNT.: 3.92 x 1000/??L (ref 1.40–6.60)
BKR WAM BASOPHIL ABSOLUTE COUNT.: 0.02 x 1000/ÂµL (ref 0.0–0.3)
BKR WAM BASOPHILS: 0.3 % (ref 0.0–3.0)
BKR WAM EOSINOPHIL ABSOLUTE COUNT.: 0.23 x 1000/??L — ABNORMAL LOW (ref 0.00–0.45)
BKR WAM EOSINOPHILS: 3.4 % (ref 0.0–4.0)
BKR WAM HEMATOCRIT: 40.8 % (ref 40.0–50.0)
BKR WAM HEMOGLOBIN: 13.5 g/dL (ref 13.0–17.0)
BKR WAM IMMATURE GRANULOCYTES: 0.3 % (ref 0.0–1.0)
BKR WAM LYMPHOCYTES: 30 % (ref 25.0–45.0)
BKR WAM MCH PG: 30.7 pg (ref 27–33)
BKR WAM MCHC: 33.1 g/dL (ref 32.0–36.0)
BKR WAM MCV: 92.7 fL (ref 79.0–99.0)
BKR WAM MONOCYTE ABSOLUTE COUNT.: 0.59 x 1000/ÂµL (ref 0.00–1.20)
BKR WAM MONOCYTES: 8.6 % (ref 0.0–12.0)
BKR WAM MPV: 11 fL (ref 7.5–11.5)
BKR WAM NEUTROPHILS: 57.4 % (ref 36.0–66.0)
BKR WAM NUCLEATED RED BLOOD CELLS: 0 % — ABNORMAL LOW (ref 0.0–5.0)
BKR WAM PLATELETS: 220 x1000/??L — ABNORMAL LOW (ref 140–400)
BKR WAM RDW-CV: 12.9 % (ref 11.5–14.5)
BKR WAM RED BLOOD CELL COUNT.: 4.4 M/ÂµL (ref 4.00–5.20)
BKR WAM WHITE BLOOD CELL COUNT.: 6.83 x1000/ÂµL (ref 4.00–10.00)

## 2019-09-30 LAB — BASIC METABOLIC PANEL
BKR ANION GAP (LM): 4 mmol/L — ABNORMAL LOW (ref 5–15)
BKR BLOOD UREA NITROGEN: 14 mg/dL (ref 7–18)
BKR CALCIUM: 8.6 mg/dL (ref 8.5–10.1)
BKR CHLORIDE: 105 mmol/L (ref 98–107)
BKR CO2: 28 mmol/L (ref 21–32)
BKR CREATININE: 0.94 mg/dL (ref 0.70–1.30)
BKR EGFR (AFR AMER) (LMC): >60 mL/min/{1.73_m2} (ref >60)
BKR EGFR (NON AFR AMER) (LMC): >60 mL/min/1.73m2 (ref >60)
BKR GLUCOSE: 110 mg/dL (ref 65–110)
BKR POTASSIUM: 3.5 mmol/L (ref 3.5–5.1)
BKR SODIUM: 137 mmol/L (ref 136–145)

## 2019-09-30 LAB — ZZZISTAT TROPONIN (LMP): BKR BILL ONLY: 1

## 2019-09-30 MED ORDER — OXYCODONE-ACETAMINOPHEN 5 MG-325 MG TABLET
5-325 mg | ORAL | Status: DC | PRN
Start: 2019-09-30 — End: 2019-09-30

## 2019-09-30 MED ORDER — IOHEXOL 240 MG IODINE/ML INTRAVENOUS SOLUTION
240 mg iodine/mL | Freq: Once | ORAL | Status: DC | PRN
Start: 2019-09-30 — End: 2019-09-30

## 2019-09-30 MED ORDER — ONDANSETRON HCL (PF) 4 MG/2 ML INJECTION SOLUTION
42 mg/2 mL | Freq: Four times a day (QID) | INTRAVENOUS | Status: DC | PRN
Start: 2019-09-30 — End: 2019-09-30

## 2019-09-30 MED ORDER — ACETAMINOPHEN 325 MG TABLET
325 mg | ORAL | Status: DC | PRN
Start: 2019-09-30 — End: 2019-09-30

## 2019-09-30 MED ORDER — LACTATED RINGERS INTRAVENOUS SOLUTION
INTRAVENOUS | Status: DC
Start: 2019-09-30 — End: 2019-09-30
  Administered 2019-09-30: 08:00:00 1000.000 mL/h via INTRAVENOUS

## 2019-09-30 MED ORDER — DOCUSATE SODIUM 100 MG CAPSULE
100 mg | Freq: Every day | ORAL | Status: DC
Start: 2019-09-30 — End: 2019-09-30

## 2019-09-30 NOTE — Plan of Care
Plan of Care Overview/ Patient Status    No pain ambulating independently.Wants d/c home.Hospitalists in and cleared pt.IVF HL.IV out. Dc home with wife.

## 2019-09-30 NOTE — Other
[facility]	General Medicine Progress NoteAttending Provider: Garnetta Buddy, MD Subjective: Interim History:Chief Complaint: chest pain?HPI:  Patient is a 59 year old Philip [name] who is postop day 4 status post robotic left partial nephrectomy.  He was discharged home on postoperative day 1. He reports that he has been taking Tylenol and ibuprofen for pain.  This evening he experienced a sudden onset of chest pain that radiated down his left arm with associated shortness of breath.  He denies any diaphoresis.  He denies any nausea or vomiting.  He reports that his abdominal pain has been unchanged since postop.?Upon admission to the emergency room he was noted to be hemodynamically stable.  His labs were normal other than an elevated D-dimer and BNP.  Patient underwent a CTA to rule out a PE.  This was negative for a pulmonary embolism; however, he was noted to have pneumoperitoneum and pneumomediastinum.  This prompted further workup with a [place] abdomen and pelvis which showed a left subcapsular renal hematoma with intra renal parenchymal hyperdensities, differential includes hemorrhage.  His H&H was normal at 13.5 and Philip.6.  Currently upon evaluation patient reports that his chest and arm pain have completely resolved.  His troponin was negative (Cited JC MD HPI 09/30/19)Interim summary:-hospitalist consulted for SOB and CO that has resolvedPt seen and examined states that he has no CP/SOB/DOE/PalpitationsCP and SOB may have been 2nd to pneumoperitoneum seen on [place] scanCardiac enzymes neg EKG is with no ST changesCT of the chest is neg for PE-an incidental finding of pneumoperitoneum was found on CTA-this may have caused the CPPt tells me that he feels well has no CP/SOB and would like to be dc homePt is stable for dcReview of Allergies/Meds/Hx: I have reviewed the patient's allergies, prior to admission meds, past medical/surgical, family and social hx. Inpatient Medications:acetaminophen, ondansetron (PF), oxyCODONE-acetaminophen, oxyCODONE-acetaminophen, sodium chlorideObjective: Vitals:Last 24 hours: Temp:  [97.7 ?F (36.5 ?C)-97.8 ?F (36.6 ?C)] 97.7 ?F (36.5 ?C)Pulse:  [68-78] 74Resp:  [16-20] 16BP: (144-165)/(81-98) 144/81SpO2:  [94 %-98 %] 94 %I/O's:Gross Totals (Last 24 hours) at 09/30/2019 1103Last data filed at 09/30/2019 0839Intake 422.5 ml Output 725 ml Net -302.5 ml Height, Weight, BSA, BMIWeight: 95 kgHeight: 6' (182.9 cm)BMI (kg/m2): 28.4[Retired] Body Surface Area (BSA) (m2): 2.2Physical Exam Constitutional: Alert & O X3HEENT: Normocephalic, AtraumaticNeck: No LAD Trachea midlineCardiovascular: HR Reg S1 S2Pulmonary/Chest: CTA Abdomen: Soft non-distended nontenderMusculoskeletal: Normal ROMNeurologic: CN 2-12 intact. No focal neuro deficits.Skin: warm and dry. Positive pedal pulses, no edema Labs:Last 24 hours: Recent Results (from the past 24 hour(s)) EKG  Collection Time: 09/29/19  5:36 PM Result Value Ref Range  ECG - HEART RATE 73 bpm  ECG - QRS Interval 86 ms  ECG - QT Interval 380 ms  ECG - QTC Interval 419 ms  ECG - P Axis 39 deg  ECG - QRS Axis 23 deg  ECG - T Wave Axis 41 deg  ECG -- P-R Interval 184 msec  ECG - SEVERITY Borderline ECG severity SARS CoV-2 (COVID-19) RNA - [facility])  Collection Time: 09/29/19  6:11 PM  Specimen: Nasopharynx; Viral Result Value Ref Range  SARS-CoV-2 RNA (COVID-19) Negative Negative D-dimer, quantitative  Collection Time: 09/29/19  6:20 PM Result Value Ref Range  D-Dimer 1.63 (H) <0.50 mg/L FEU NT-proBrain natriuretic peptide  Collection Time: 09/29/19  6:20 PM Result Value Ref Range  B-Type Natriuretic Peptide, Pro (proBNP) 161.0 (H) <125 pg/mL Basic metabolic panel  Collection Time: 09/29/19  6:20 PM Result Value Ref  Range  Glucose 91 65 - 110 mg/dL  BUN 18 7 - 18 mg/dL  Creatinine 1.61 0.96 - 1.30 mg/dL  eGFR (AFRICAN-AMERICAN) >60 >60 mL/min/1.27m2  eGFR (NON African-American) >60 >60 mL/min/1.48m2  Sodium 137 136 - 145 mmol/L  Potassium 3.5 3.5 - 5.1 mmol/L  Chloride 105 98 - 107 mmol/L  CO2 24 21 - 32 mmol/L  Anion Gap 8 5 - 15 mmol/L  Calcium 8.5 8.5 - 10.1 mg/dL iSTAT Troponin (LMP)  Collection Time: 09/29/19  6:21 PM Result Value Ref Range  Bill Only 1  CBC auto differential  Collection Time: 09/29/19  6:21 PM Result Value Ref Range  WBC 5.54 4.00 - 10.00 x1000/?L  RBC 4.39 4.00 - 5.20 M/?L  Hemoglobin 13.5 13.0 - 17.0 g/dL  Hematocrit 04.5 Philip.9 - 50.0 %  MCV 92.5 79.0 - 99.0 fL  MCH 30.8 27 - 33 pg  MCHC 33.3 32.0 - 36.0 g/dL  RDW-CV 81.1 91.4 - 78.2 %  Platelets 221 140 - 400 x1000/?L  MPV 9.9 7.5 - 11.5 fL  Neutrophils 55.9 36.0 - 66.0 %  Lymphocytes 31.6 25.0 - 45.0 %  Monocytes 8.7 0.0 - 12.0 %  Eosinophils 3.4 0.0 - 4.0 %  Basophil 0.2 0.0 - 3.0 %  Immature Granulocytes 0.2 0.0 - 1.0 %  nRBC 0.0 0.0 - 5.0 %  ANC (Abs Neutrophil Count) 3.10 1.Philip - 6.60 x 1000/?L  Absolute Lymphocyte Count 1.75 1.00 - 4.50 x 1000/?L  Monocyte Absolute Count 0.48 0.00 - 1.20 x 1000/?L  Eosinophil Absolute Count 0.19 0.00 - 0.45 x 1000/?L  Basophil Absolute Count 0.01 0.0 - 0.3 x 1000/?L  Absolute Immature Granulocyte Count 0.01 0.00 - 0.10 x 1000/?L Troponin I (POC)     (LM[facility])  Collection Time: 09/29/19  6:25 PM Result Value Ref Range  Troponin I (POC) <0.04 <0.06 ng/mL Basic metabolic panel  Collection Time: 09/30/19  3:21 AM Result Value Ref Range  Glucose 110 65 - 110 mg/dL  BUN 14 7 - 18 mg/dL  Creatinine 9.56 2.13 - 1.30 mg/dL  eGFR (AFRICAN-AMERICAN) >60 >60 mL/min/1.59m2  eGFR (NON African-American) >60 >60 mL/min/1.49m2  Sodium 137 136 - 145 mmol/L  Potassium 3.5 3.5 - 5.1 mmol/L  Chloride 105 98 - 107 mmol/L  CO2 28 21 - 32 mmol/L  Anion Gap 4 (L) 5 - 15 mmol/L  Calcium 8.6 8.5 - 10.1 mg/dL CBC auto differential  Collection Time: 09/30/19  3:21 AM Result Value Ref Range  WBC 6.83 4.00 - 10.00 x1000/?L  RBC 4.Philip 4.00 - 5.20 M/?L  Hemoglobin 13.5 13.0 - 17.0 g/dL  Hematocrit 08.6 57.8 - 50.0 %  MCV 92.7 79.0 - 99.0 fL  MCH 30.7 27 - 33 pg  MCHC 33.1 32.0 - 36.0 g/dL  RDW-CV 46.9 62.9 - 52.8 %  Platelets 220 140 - 400 x1000/?L  MPV 11.0 7.5 - 11.5 fL  Neutrophils 57.4 36.0 - 66.0 %  Lymphocytes 30.0 25.0 - 45.0 %  Monocytes 8.6 0.0 - 12.0 %  Eosinophils 3.4 0.0 - 4.0 %  Basophil 0.3 0.0 - 3.0 %  Immature Granulocytes 0.3 0.0 - 1.0 %  nRBC 0.0 0.0 - 5.0 %  ANC (Abs Neutrophil Count) 3.92 1.Philip - 6.60 x 1000/?L  Absolute Lymphocyte Count 2.05 1.00 - 4.50 x 1000/?L  Monocyte Absolute Count 0.59 0.00 - 1.20 x 1000/?L  Eosinophil Absolute Count 0.23 0.00 - 0.45 x 1000/?L  Basophil Absolute Count 0.02 0.0 - 0.3 x 1000/?L  Absolute Immature  Granulocyte Count 0.02 0.00 - 0.10 x 1000/?L Diagnostics:I have reviewed the patient's Radiology report(s) within the last 48 hrs. Significant findings are SARS CoV-2 (COVID-19) RNA - [facility])Order: [id number] Status:  Final result ??Visible to patient:  Yes (not seen) Next appt:  10/09/2019 at 10:45 AM in Urology Floreen Comber, [place])Specimen Information: Nasopharynx; Viral ?  	0 Result Notes Ref Range & Units 09/29/19 ?6:11 PM 09/22/19 ?9:38 AM SARS-CoV-2 RNA (COVID-19) Negative Negative  Negative CM  Comment: SARS-CoV-2 target nucleic acids not detected. Fact Sheet for Healthcare Providers: https://pope.com/ ? Fact Sheet for Patients: BoilerBrush.com.cy Test performed using a GeneXpert real-time RT-PCR assay. Test performance has not been evaluated in asymptomatic patients. Test ordering and result interpretation is at the discretion of the ordering provider. Resulting Agency      CTA CHEST (PE) W IV CONTRAST  ?HISTORY: Pulmonary embolism (PE) suspected, high prob.?COMPARISON: None.???[id number] - RADIATION DOSE ACQUIRED DURING SCAN: 400.16 mGy cm.[employer] strives for high quality imaging with the lowest possible radiation dose. For more information on medical radiation exposure please visit: www.NameRecipe.com.au .?TECHNIQUE: Contiguous axial images were obtained through the entire chest using the standard pulmonary embolism protocol. 90 cc of Omnipaque 350 were administered intravenously.  Sagittal and coronal reformatted images were provided along with axial, sagittal, and coronal MIP images.  3D/MIP reformatted images were constructed on a dependent workstation and stored per protocol.?FINDINGS:?Vascular: No CTA evidence of acute filling defect or pulmonary embolism.Aorta is within normal limits.?Cardiomegaly mediastinal silhouette is within normal limits.?Soft tissues: Significant subcutaneous emphysematous changes along the left chest wall.Small amount of pneumomediastinum noted along the anterior mediastinal region. Pneumoperitoneum noted in the upper abdomen.?LUNGS: Dependent atelectasis. No gross evidence of pneumothorax appreciated.?Incidental note is made of mild hepatic steatosis.Trace free fluid noted in the abdomen. Mild left perinephric stranding.?Osseous structures: No acute osseous abnormality.??IMPRESSION: ??1. Pneumoperitoneum, subtle pneumomediastinum and moderate to severe subcutaneous emphysematous changes along the left chest wall. Findings are suspicious for perforation of hollow viscus. Further assessment with [place] abdomen pelvis recommended.2. No CTA evidence of acute pulmonary embolism.3. No evidence of pneumothorax.?Critical results discussed with Dr. Ellene Route, M.D. at 8:10 PM on 9/3/2021CT ABDOMEN PELVIS WO IV CONTRAST  ?HISTORY: Abdominal pain, post-op.?COMPARISON: [place] chest also dated 09/29/2019. [place] abdomen and pelvis dated 02/01/2019??O1308 - RADIATION DOSE ACQUIRED DURING SCAN: 583.07 mGy.cm.[employer] strives for high quality imaging with the lowest possible radiation dose. For more information on medical radiation exposure please visit: www.NameRecipe.com.au . -- M5784 and  [phone]?TECHNIQUE: Contiguous axial images were obtained through the abdomen and pelvis  text Sagittal and coronal reformatted images were provided along with axial, sagittal, and coronal MIP images.???FINDINGS: ?ABDOMEN?Chest base: Dependent atelectatic changes. Pneumomediastinum and air in the epicardiac fat. Moderate to severe subcutaneous emphysematous changes.Metallic hyperdensity noted in the region of inter atrial septum.?Peritoneum: Pneumoperitoneum. Air is also noted in the left retroperitoneal and extraperitoneal region.?Abdominal wall: Significant amount of subcutaneous emphysema noted bilaterally, left greater than right. There is also extending into the inguinal and scrotal fat.?Liver:  Air extending along the falciform ligament, pericaval region and porta hepatis.Marland KitchenUnremarkable gallbladder.?Spleen: Unremarkable?Pancreas:  No gross abnormality.?Adrenal glands: Within normal limits?Kidneys and collecting system:  Left renal cortical hypodensity especially along the mid to lower left kidney may represent residual asymmetric contrast enhancement in the patient with recent intravenous contrast. Differential possibility includes hemorrhagic changes in a patient with recent procedure. Perinephric and subcapsular air is noted. Moderate stranding and trace fluid.Trace hyperdensity extending into bilateral pelvic calyceal  system, left greater than right likely representing excreting contrast.?Vessels:  Atherosclerotic disease.?Lymph nodes:  No gross lymphadenopathy.?GI tract/mesentery: No acute bowel abnormality. Diverticulosis. Orally ingested contrast does not demonstrate any evidence of extravasation.?Bladder:  Excreted contrast noted in the urinary bladder. Mild bladder wall thickness.?Pelvis: No evidence of free fluid. Mild prostatomegaly.?Soft tissues/musculoskeletal: Degenerative changes.??IMPRESSION:??1. Pneumoperitoneum and extraperitoneal air. Small air bubbles noted in the left subcapsular and perinephric region. Findings are most likely attributed to recent partial nephrectomy. Close monitoring recommended.2. Cortical hyperdensity mid to lower left kidney may be associated with residual contrast associated with asymmetric decreased left renal function. Differential possibility includes cortical hemorrhagic changes. Monitoring is recommended.3. No gross evidence of acute bowel abnormality.?Presence of pneumoperitoneum and pneumomediastinum was discussed with the ER at the time of [place] chest 8:10 PM on 09/29/2019.?XR CHEST PA OR AP ?HISTORY: Chest Pain.?COMPARISON: 11/09/2016?TECHNIQUE: AP upright portable?FINDINGS:?Low lung volumes. Cardiac mediastinal silhouette is within normal limits. Subcutaneous emphysematous changes left chest wall.?IMPRESSION:?No acute pulmonary disease. Subcutaneous emphysematous changes. Low lung volumes.?EDF: YResults: EKGStatus: Final result (Collected: 09/29/2019 ?5:36 PM) EKGOrder: [id number] Status:  Final result ??Visible to patient:  Yes (not seen) Next appt:  10/09/2019 at 10:45 AM in Urology Floreen Comber, [place])	0 Result Notes Ref Range & Units 09/29/19 ?5:36 PM 12/19/18 ?1:04 PM ECG - HEART RATE bpm 73  73  ECG - QRS Interval ms 86  84  ECG - QT Interval ms 380  344  ECG - QTC Interval ms 419  379  ECG - P Axis deg 39  52  ECG - QRS Axis deg 23  49  ECG - T Wave Axis deg 41    ECG -- P-R Interval msec 184  144 CM  ECG - SEVERITY severity Borderline ECG   Comment: :SINUS RHYTHM:PROBABLE LEFT ATRIAL ABNORMALITY:BORDERLINE T WAVE ABNORMALITIES:PREVIUOS SHOWED FREQUENT PVCS:Electronically Signed On 09-29-2019 17:56:20 EDT by ?Lenise Arena, MD    Assessment: Assessment:1. CP/SOB2. Pneumoperitoneum3. HTN4. Hyperlidpemia5. S/p POD # 4 partal left nephrectomyI have reviewed the patient's problem list and updated it as needed.Plan: 1. CP/SOB-PE ruled out-may be 2nd to pneumoperitoneum-cardiac enzymes neg-EKG stable-pt is with no c/o of CP-asa-statin-B/p med's2. Pneumoperitoneum-appreciate sx following-ok to dc per sx3. HTN-b/p is in good control4. Hyperlipdemia-statin5. S/p POD # 4 partal left nephrectomy-f/b urology this admit-stable [place] of the abdomen-H/h stable6. Dis po: Medically cleared for dc to homeMedications held:NoneVTE Prophylaxis: SCDLength of Visit 35 mins greater than 50% spent coordinating care and/or counselingDiscussed case with: PTand RNSigned:Darius Fillingim  Ashley Jacobs, APRN Beeper: 31409/4/202111:03 AMGreater than 35 minutes was spent on face to face physical exam, med reconciliation, reviewing labs,discussion of code status, interdisciplinary discussion with consultants, nursing, and updating the pt's family regarding the plan of care.The pt is his own decision maker and is in agreement with the plan of careI have utilized all available immediate resources to obtain, update and reviewed the pt's current medicationsI confirmed that the patient's Advance Care Plan is present, code status is documented, or surrogate decision maker is listed in the patient's medical record.Code Status:full

## 2019-09-30 NOTE — Plan of Care
Problem: Adult Inpatient Plan of CareGoal: Readiness for Transition of CareOutcome: Outcome(s) achieved Plan of Care Overview/ Patient Status    Discharge anticipated today.Patient discharging home, no needs, wife to transport. CM Assessment and Discharge Planning    Most Recent Value Case Management Assessment Do you have a caregiver? Yes Permission to Speak to Caregiver? No Patient Requires Care Coordination Intervention Due To discharge planning needs/concerns, change in physical function, readmission within the last 30 days Arrived from prior to admission home Bed Hold  no Services Prior to Admission none ADL Assistance None Type of Home Care Services None Equipment Currently Used at Home none Documented Insurance Accurate Yes Any financial concerns related to anticipated discharge needs No Patient's home address verified Yes Patient's PCP of record verified Yes Last Date Seen by PCP 0-3 months Patient has new PCP (specify name). PLEASE UPDATE DEMOGRAPHICS Darout, Philip Reaper, MD Living Environment  Lives With Spouse Current Living Arrangements home/apartment/condo Home Accessibility bed and bath on same level, stairs within home, stairs to enter home Number of Stairs, Main Entrance six Surface of Stairs, Main Entrance concrete Stair Railings, Main Entrance railings safe and in good condition Landing, Stairs, Main Entrance adequate turning radius Number of Stairs, Within Home, Primary eleven-fourteen Surface of Stairs, Within Home, Primary hardwood Stair Railings, Within Home, Primary railings safe and in good condition Landing, Stairs, Within Home, Primary adequate turning radius Transportation Available family or friend will provide Home Safety Feels Safe Living In Home yes Source of Clinical History Patient's clinical history has been reviewed and source of Information is: Patient Completed Assessment Completed Initial Assesment by: Name Rachell Cipro Role: Transition Coordinator CM/SW Attestation: Choose which ONE is appropriate for you I have reviewed the medical record and completed the above evaluation with the following recommendations. N/A I have reviewed the medical record and agree with the above evaluation by the transition coordinator/discharge planning coordinator with the following recommendations. Yes Discharge Planning Coordination Recommendations Discharge Planning Coordination Recommendations Home with MD follow-up/No needs identified RN reviewed plan of care/ continuum of care need's with  Interdisciplinary Team Discharge Planning Concerns to be Addressed no discharge needs identified, denies needs/concerns at this time Patient/Patient Representative was presented with a list of choices of facilities, agencies and/or dme providers They had no preference Referral(s) placed for: None Home Health Care Services Required N/A Patient is considered homebound due to: (he/she requires considerable and taxing effort to leave their residence for medical reasons or religious services OR infrequently OR of short duration for other reasons) n/a Equipment Needed After Discharge none Mode of Transportation  Private car  (add comment for special considerations) Transportation is scheduled as a will call? no Patient to be accompanied by spouse Is the patient discharging to their home address Yes CM D/C Readiness PASRR completed and approved N/A Authorization number obtained, if required N/A Is there a 3 day INPATIENT Qualifying stay for Medicare Patients? N/A DME Authorized/Delivered N/A No needs identified/ follow up with PCP/MD Yes Post acute care services secured W10 complete Yes Pri Completed and Accepted  N/A Finalized Plan Expected Discharge Date 09/30/19 Discharge Disposition Home or Self Care Post acute care services secured W10 complete Yes Physician documentation required AVS (Patient Instructions), Priority Discharge Summary Discharge Coordination/Progress Dc home, no needs, wife to transport.   Mila Homer, RN, BSNNurse Case [phone]  [phone].[email or web address]

## 2019-09-30 NOTE — Plan of Care
Plan of Care Overview/ Patient Status    Pt alert and oriented x4, POD 4 robotic left partial nephrectomy, came in with chest pain and SOB.  CP and SOB are resolved.  Abdominal discomfort but tolerable.  Abdomen is slightly distended, bowel sounds positive. [place] scan showed left subcapsular renal hematoma.  IV fluids running.  Will recheck labs in the morning.  Pt due to void.
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
## 2019-09-30 NOTE — Utilization Review (ED)
UM Status: Commercial - IP Terie Purser, BSN, RN L&M ED Case [phone]

## 2019-09-30 NOTE — ED Notes
7:04 PM Pt care and report from [place], RN11:24 PMPt completed oral intakeCT notified 3:00 AMPt given food and drink as requested Floor Handoff Telemetry: 	[x]  Yes		[]  NoCode Status:   [x]  Full		[]  DNR		[]  DNI		Other (specify):Safety Precautions: [x]  None	[]  Sitter   []  Restraints	[]  Suicidal	[]  Fall Risk	Other (specify):Mentation/Orientation:	 A&O (Self, person, place, time) x   4       	 Disoriented to:                    	 Deficits: []  Hearing impaired	[]  Blind  []  Nonverbal	 []  Mental retardationOxygenation Upon Admission: [x]  RA	[]  NC	[]  Venti  []  Simple Mask []  Other	Baseline O2 Status? [x]  Yes	[]  NoAmbulation: [x]  Independent	[]  Cane   []  Walker	[]  Wheelchair	[]  Bedbound		[]  Hemiplegic	[]  Paraplegic	[]  QuadraplegicEliminiation: [x]  Independent	[]  Commode	[]  Bedpan/Urinal  []  Straight Cath []  Foley cath			[]  Urostomy	[]  Colostomy	Other (specify):Diarrhea/Loose stool : []  1x within 24h  []  2x within 24h  []  3x within 24h  []  None 	C.Diff Order: 	[]  Ordered- needs to be collected             []  Collected-sent to lab             []  Resulted - Negative C.Diff             []  Resulted - Positive C.Diff[]  Not Ordered   []  N/ASkin Alteration: []  Pressure Injury [x]  Wound []  None []  Skin not assessedDiet: []  Regular/No order placed	[x]  NPO		Other (specify):IV Access: [x]  PIV   []  PICC    []  Port    [] Central line    []  A-line    Other (specify)IVF/GTT Running Upon ED Departure? []  No	    [x]  Yes (specify): LR 75Outstanding Meds/Treatments/Tests: NONEPatient Belongings: Placed in bag prior to this RN's initial interaction with this ptAre the belongings documented?          [x]  No	    []  YesIs someone taking belongings home?   [x]  No     []  Yes  Who? (specify)                                   ED RN and Contact number/MHB #:         DEOLIVEIRA, RN

## 2019-09-30 NOTE — Discharge Instructions
-   Please refer to d/c instructions from last discharge for post op care instructions.- Follow up as scheduled in the urology clinic in about a week.
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
## 2019-09-30 NOTE — ED Notes
10:24 PM Pt began drinking gastrografin now, to have each serving spaced 15 minutes apart. Next serving due at 2039

## 2019-09-30 NOTE — Other
PHARMACY-ASSISTED MEDICATION REPORT    Pharmacist review of the best possible medication history obtained by the pharmacy medication history technician has been performed.      I have updated the home medication list and identified the following information that may be relevant to this admission.      NOTES/RECOMMENDATIONS   The medication history was updated to the best possible state. The patient was interviewed by the pharmacy medication history technician.   Exact dose/frequency of medications were provided.  The medication list was reconciled with surescripts database refill history.                     Prior to Admission Medications     Medication Name Sig Taking? Patient Reported       ALPRAZolam (XANAX) 0.5 mg tablet  Last dose: Unknown at Unknown time  Last Medication Note: >> Renato Gails   Sat Sep 30, 2019  3:40 AM  [facility] Tech(Zain Arnaout, CPHT): per pt PRN only   Entered by Renato Gails, CPHT Sat Sep 30, 2019 0340 Take 1 tablet (0.5 mg total) by mouth nightly as needed for anxiety.  Patient not taking: Reported on 09/20/2019           aspirin 81 mg EC delayed release tablet  Last dose: 09/29/2019 at Unknown time  Last Medication Note: >> Renato Gails   YNW Sep 30, 2019  3:40 AM     Entered by Renato Gails, CPHT Sat Sep 30, 2019 0340 Take 81 mg by mouth daily. Yes Yes       docusate sodium (COLACE) 100 mg capsule  Last dose: Not Taking at Unknown time  Last Medication Note: >> Renato Gails   Sat Sep 30, 2019  3:42 AM  [facility] Tech(Zain Arnaout, CPHT): pt reported not taking  Entered by Renato Gails, CPHT Sat Sep 30, 2019 2956 Take 1 capsule (100 mg total) by mouth daily.  Patient not taking: Reported on 09/30/2019        **Flagged for Removal**     ketoconazole (NIZORAL) 2 % cream  Last dose: Past Week at Unknown time  Last Medication Note: >> Renato Gails   OZH Sep 30, 2019  3:40 AM     Entered by Renato Gails, CPHT Sat Sep 30, 2019 0865 Apply 1 Application topically 2 (two) times daily as needed. To rash on scalp and face Yes Yes       oxyCODONE (ROXICODONE) 5 mg Immediate Release tablet  Last dose: Not Taking at Unknown time  Last Medication Note: >> Renato Gails   HQI Sep 30, 2019  3:41 AM  Completed   Entered by Renato Gails, CPHT Sat Sep 30, 2019 6962 Take 1 tablet (5 mg total) by mouth every 4 (four) hours as needed for up to 3 days.  Patient not taking: Reported on 09/30/2019        **Flagged for Removal**     pravastatin (PRAVACHOL) 40 mg tablet  Last dose: 09/28/2019 Take 1 tablet (40 mg total) by mouth nightly.           triamcinolone (KENALOG) 0.025 % cream  Last dose: Past Week at Unknown time  Last Medication Note: >> Renato Gails   XBM Sep 30, 2019  3:40 AM     Entered by Renato Gails, CPHT Sat Sep 30, 2019 8413 Apply 1 Application topically 2 (two) times daily as needed. To rash on face up to 2 weeks a month for flare ups Yes Yes  valsartan (DIOVAN) 160 mg tablet  Last dose: 09/28/2019  Last Medication Note: >> Renato Gails   Sat Sep 30, 2019  3:42 AM  [facility] Tech(Zain Arnaout, CPHT): pt takes it nightly   Entered by Renato Gails, CPHT Sat Sep 30, 2019 1914 TAKE 1 TABLET BY MOUTH ONCE DAILY  Patient taking differently: Take 160 mg by mouth nightly.            Prior to admission medications last reviewed by Jacinto Reap, PharmD on Sat Sep 30, 2019 7829         Thank you,  Jacinto Reap, PharmD  09/30/2019  4:15 AM
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
## 2019-10-04 ENCOUNTER — Encounter: Admit: 2019-10-04 | Payer: PRIVATE HEALTH INSURANCE | Attending: Anatomic Pathology & Clinical Pathology

## 2019-10-04 ENCOUNTER — Encounter: Admit: 2019-10-04 | Payer: PRIVATE HEALTH INSURANCE | Attending: Urology

## 2019-10-05 ENCOUNTER — Telehealth: Admit: 2019-10-05 | Payer: PRIVATE HEALTH INSURANCE | Attending: Urology

## 2019-10-05 NOTE — Telephone Encounter
-----   Message from [name] sent at 10/05/2019  9:59 AM EDT -----Regarding: Insurance CoverageSusan, I specifically asked Dr Genice Rouge prior to surgery, in person, if this would be an issue with insurance.  He told me it would not be an issue.  Sending me to records is not the solution and I need to speak to someone who is involved in this.  The answer cannot be it is not what you do.  I am looking for someone specific to speak to.  I have been in contact and spoken to specific people at [facility] and they have given me specific direction.  I am only asking that same respect from your end.  Thx

## 2019-10-09 ENCOUNTER — Ambulatory Visit: Admit: 2019-10-09 | Payer: PRIVATE HEALTH INSURANCE | Attending: Urology

## 2019-10-09 ENCOUNTER — Ambulatory Visit: Admit: 2019-10-09 | Payer: BLUE CROSS/BLUE SHIELD | Attending: Urology

## 2019-10-09 ENCOUNTER — Encounter: Admit: 2019-10-09 | Payer: PRIVATE HEALTH INSURANCE | Attending: Urology

## 2019-10-09 DIAGNOSIS — I10 Essential (primary) hypertension: Secondary | ICD-10-CM

## 2019-10-09 DIAGNOSIS — I499 Cardiac arrhythmia, unspecified: Secondary | ICD-10-CM

## 2019-10-09 DIAGNOSIS — E7800 Pure hypercholesterolemia, unspecified: Secondary | ICD-10-CM

## 2019-10-09 DIAGNOSIS — I639 Cerebral infarction, unspecified: Secondary | ICD-10-CM

## 2019-10-09 DIAGNOSIS — K219 Gastro-esophageal reflux disease without esophagitis: Secondary | ICD-10-CM

## 2019-10-09 DIAGNOSIS — C499 Malignant neoplasm of connective and soft tissue, unspecified: Secondary | ICD-10-CM

## 2019-10-09 DIAGNOSIS — C801 Malignant (primary) neoplasm, unspecified: Secondary | ICD-10-CM

## 2019-10-09 DIAGNOSIS — Q2112 PFO (patent foramen ovale): Secondary | ICD-10-CM

## 2019-10-09 NOTE — Telephone Encounter
Called patient to let him know that his case has been sent to the authorization pool for peer review thru [facility]. As of today he has not received a bill, only a rejection letter.

## 2019-10-09 NOTE — Progress Notes
Here with me for pathology review.  Philip [name] was out today.He had a left cystic renal mass.  He had a family history of renal malignancy in his father.  He has recovered well from the procedure.  His incisions look clean and dry with Dermabond.  There was no evidence of hernia.  He has been limiting his activities.  He has been eating and drinking and passing gas without any problem.  He did have an episode of chest pain that prompted ER presentation but he is able to be discharged home shortly after.His pathology demonstrates the following:Pathology [facility] [address] [place], [place] [address] Phone: [phone] Lab Number ?[id number] L+M SURGICAL PATHOLOGY REPORT Patient Name: ?[name], Philip Date of Birth: ? [birth year] Med. Rec. #: ? ? [id number] Gender: ? ? M Date Taken: ? ? 09/25/2019 Date Received: ? ? 09/26/2019 Date Reported: ? ? 09/29/2019 Case Number: [id number] Submitting Clinician: JOSEPH BRITO FINAL DIAGNOSIS: Left renal mass, excision: ? ? ?- Low-grade cystic renal neoplasm. ? ? ?- SEE COMMENT. Comment Sections show a cystic multiloculated tumor with a clear cell lining. The stroma shows hyalinized cores along with dense luteinized-appearing fibrosis. The differential diagnosis is between a cystic nephroma and a low-grade clear cell renal cell carcinoma. Portions of the fibrous wall of the tumor are present at the inked margin. However, there is no definitive epithelium present at the margin. Since in areas the wall of the tumor is attenuated, the overall?margin status is uncertain. This case will be sent for a second confirmatory opinion and those results will be issued in an addendum.  Case is being reviewed for 2nd opinion for margin status.  Patient is due for MRI in January, which had been ordered by Dr. Genice Rouge.From my perspective, certainly appears that he is doing well.  I told him that his case demonstrates what appears to be relatively favorable pathology.  He was somewhat devastated to find that this could be representative of cancer but I told him that this was a low-grade carcinoma.  The margin status is being currently reviewed but regardless, I think he has a very good prognosis based upon the pathologic findings.He asked about increasing activity in a told him that for the 1st 6 weeks after surgery it would be ideal to limit any heavy lifting.  He asked if he could mow his self propelled lawnmower and I told him that he should be able to so long as he does not have to lift up the lawnmower.  He states that he does not.

## 2019-10-10 DIAGNOSIS — C649 Malignant neoplasm of unspecified kidney, except renal pelvis: Secondary | ICD-10-CM

## 2019-10-24 ENCOUNTER — Encounter: Admit: 2019-10-24 | Payer: PRIVATE HEALTH INSURANCE | Attending: Urology

## 2020-01-30 ENCOUNTER — Inpatient Hospital Stay: Admit: 2020-01-30 | Discharge: 2020-01-30 | Payer: BLUE CROSS/BLUE SHIELD

## 2020-01-30 ENCOUNTER — Inpatient Hospital Stay: Admit: 2020-01-30 | Discharge: 2020-01-30 | Disposition: A | Discharge status: Home / Self Care | Payer: Self-pay

## 2020-01-30 DIAGNOSIS — N281 Cyst of kidney, acquired: Secondary | ICD-10-CM

## 2020-01-30 DIAGNOSIS — D734 Cyst of spleen: Secondary | ICD-10-CM

## 2020-01-30 DIAGNOSIS — R161 Splenomegaly, not elsewhere classified: Secondary | ICD-10-CM

## 2020-01-30 MED ORDER — SODIUM CHLORIDE 0.9 % BOLUS (NEW BAG)
0.9 % | Freq: Once | INTRAVENOUS | Status: CP
Start: 2020-01-30 — End: ?
  Administered 2020-01-30: 17:00:00 0.9 mL/h via INTRAVENOUS

## 2020-01-30 MED ORDER — GADOTERATE MEGLUMINE 0.5 MMOL/ML (376.9 MG/ML) INTRAVENOUS SOLUTION
0.5 mmol/mL (376.9 mg/mL) | Freq: Once | INTRAVENOUS | Status: CP | PRN
Start: 2020-01-30 — End: ?
  Administered 2020-01-30: 17:00:00 0.5 mL via INTRAVENOUS

## 2020-02-05 ENCOUNTER — Encounter: Admit: 2020-02-05 | Payer: PRIVATE HEALTH INSURANCE | Attending: Urology

## 2020-02-16 ENCOUNTER — Inpatient Hospital Stay: Admit: 2020-02-16 | Discharge: 2020-02-16 | Payer: BLUE CROSS/BLUE SHIELD

## 2020-02-16 ENCOUNTER — Ambulatory Visit: Admit: 2020-02-16 | Payer: BLUE CROSS/BLUE SHIELD | Attending: Urology

## 2020-02-16 ENCOUNTER — Encounter: Admit: 2020-02-16 | Payer: PRIVATE HEALTH INSURANCE | Attending: Urology

## 2020-02-16 DIAGNOSIS — I10 Essential (primary) hypertension: Secondary | ICD-10-CM

## 2020-02-16 DIAGNOSIS — C649 Malignant neoplasm of unspecified kidney, except renal pelvis: Secondary | ICD-10-CM

## 2020-02-16 DIAGNOSIS — K219 Gastro-esophageal reflux disease without esophagitis: Secondary | ICD-10-CM

## 2020-02-16 DIAGNOSIS — I499 Cardiac arrhythmia, unspecified: Secondary | ICD-10-CM

## 2020-02-16 DIAGNOSIS — I639 Cerebral infarction, unspecified: Secondary | ICD-10-CM

## 2020-02-16 DIAGNOSIS — Q2112 PFO (patent foramen ovale): Secondary | ICD-10-CM

## 2020-02-16 DIAGNOSIS — C801 Malignant (primary) neoplasm, unspecified: Secondary | ICD-10-CM

## 2020-02-16 DIAGNOSIS — C499 Malignant neoplasm of connective and soft tissue, unspecified: Secondary | ICD-10-CM

## 2020-02-16 DIAGNOSIS — E7800 Pure hypercholesterolemia, unspecified: Secondary | ICD-10-CM

## 2020-02-16 DIAGNOSIS — R161 Splenomegaly, not elsewhere classified: Secondary | ICD-10-CM

## 2020-02-16 LAB — CBC WITH AUTO DIFFERENTIAL
BKR WAM ABSOLUTE IMMATURE GRANULOCYTES.: 0.02 x 1000/??L (ref 0.00–0.30)
BKR WAM ABSOLUTE LYMPHOCYTE COUNT.: 1.86 x 1000/??L — ABNORMAL LOW (ref 0.60–3.70)
BKR WAM ABSOLUTE NEUTROPHIL COUNT.: 2.64 x 1000/??L (ref 2.00–7.60)
BKR WAM BASOPHIL ABSOLUTE COUNT.: 0.05 x 1000/??L (ref 0.00–1.00)
BKR WAM BASOPHILS: 0.9 % (ref 0.0–1.4)
BKR WAM EOSINOPHIL ABSOLUTE COUNT.: 0.24 x 1000/??L — ABNORMAL LOW (ref 0.00–1.00)
BKR WAM EOSINOPHILS: 4.4 % (ref 0.0–5.0)
BKR WAM HEMATOCRIT (2 DEC): 42.9 % (ref 38.50–50.00)
BKR WAM HEMOGLOBIN: 14.6 g/dL (ref 13.2–17.1)
BKR WAM IMMATURE GRANULOCYTES: 0.4 % (ref 0.0–1.0)
BKR WAM LYMPHOCYTES: 34.1 % (ref 17.0–50.0)
BKR WAM MCH PG: 30.9 pg (ref 27.0–33.0)
BKR WAM MCHC: 34 g/dL (ref 31.0–36.0)
BKR WAM MCV: 90.9 fL (ref 80.0–100.0)
BKR WAM MONOCYTES: 11.9 % (ref 4.0–12.0)
BKR WAM MPV: 10.3 fL (ref 8.0–12.0)
BKR WAM NEUTROPHILS: 48.3 % (ref 39.0–72.0)
BKR WAM NUCLEATED RED BLOOD CELLS: 0 % — ABNORMAL LOW (ref 0.0–1.0)
BKR WAM PLATELETS: 236 x1000/??L (ref 150–420)
BKR WAM RDW-CV: 13.1 % (ref 11.0–15.0)
BKR WAM RED BLOOD CELL COUNT.: 4.72 M/??L (ref 4.00–6.00)
BKR WAM WHITE BLOOD CELL COUNT: 5.5 x1000/??L (ref 4.0–11.0)

## 2020-02-16 LAB — COMPREHENSIVE METABOLIC PANEL
BKR ALANINE AMINOTRANSFERASE (ALT): 33 U/L (ref 16–61)
BKR ALBUMIN: 3.9 g/dL (ref 3.4–5.0)
BKR ALKALINE PHOSPHATASE: 70 U/L (ref 45–117)
BKR ANION GAP (LM): 4 mmol/L — ABNORMAL LOW (ref 5–15)
BKR ASPARTATE AMINOTRANSFERASE (AST): 18 U/L — ABNORMAL LOW (ref 15–37)
BKR BILIRUBIN TOTAL: 0.5 mg/dL (ref <1.0)
BKR BLOOD UREA NITROGEN: 19 mg/dL — ABNORMAL HIGH (ref 7–18)
BKR CALCIUM: 9.1 mg/dL (ref 8.5–10.1)
BKR CHLORIDE: 104 mmol/L (ref 98–107)
BKR CO2: 27 mmol/L (ref 21–32)
BKR CREATININE: 1.12 mg/dL (ref 0.70–1.30)
BKR EGFR (AFR AMER) (LMC): >60 mL/min/{1.73_m2} (ref >60)
BKR EGFR (NON AFR AMER) (LMC): >60 mL/min/{1.73_m2} (ref >60)
BKR GLOBULIN: 3.6 g/dL (ref 2.5–5.0)
BKR GLUCOSE: 98 mg/dL (ref 65–110)
BKR POTASSIUM: 4.4 mmol/L (ref 3.5–5.1)
BKR PROTEIN TOTAL: 7.5 g/dL (ref 6.4–8.2)
BKR SODIUM: 135 mmol/L — ABNORMAL LOW (ref 136–145)

## 2020-02-16 NOTE — Progress Notes
[facility]     Urology Office Note      Date of visit: 02/16/2020    Followup for: Renal cancer    History of Present Illness: Philip [name] is a 60 y.o. gentleman with family history of renal cell carcinoma, prior stroke, gastroesophageal reflux, hypertension initially referred for renal cysts seen on ultrasound. ?He was sent for MRI to better characterize the area in question which demonstrated a?1.8 centimeter complex cystic mass in the posterior aspect of the left kidney. He underwent partial nephrectomy on 09/25/2019 and presents today for follow up to review his MRI.     He reports no new medical issues since the last visit. He also reports that his incision has healed well.     PMH:  has a past medical history of Arrhythmia, Cancer (HC Code) (HC CODE), GERD (gastroesophageal reflux disease), Hypercholesteremia, Hypertension, Leiomyosarcoma (HC Code) (HC CODE), PFO (patent foramen ovale), and Stroke (HC Code) (HC CODE).    PSH:  has a past surgical history that includes Patent foramen ovale closure (2016); Hand surgery (Right); LEIOMYOSARCOMA EXCISION (Right); Shoulder arthroscopy (Right); Shoulder arthroscopy (Right); Colonoscopy; Upper gastrointestinal endoscopy; and INTERCOSTAL NERVE BLOCK (Right).    Medications:   Current Outpatient Medications:   ?  aspirin 81 mg EC delayed release tablet, Take 81 mg by mouth daily., Disp: , Rfl:   ?  ketoconazole (NIZORAL) 2 % cream, Apply 1 Application topically 2 (two) times daily as needed. To rash on scalp and face, Disp: , Rfl:   ?  pravastatin (PRAVACHOL) 40 mg tablet, Take 1 tablet (40 mg total) by mouth nightly., Disp: 90 tablet, Rfl: 3  ?  triamcinolone (KENALOG) 0.025 % cream, Apply 1 Application topically 2 (two) times daily as needed. To rash on face up to 2 weeks a month for flare ups, Disp: , Rfl:   ?  valsartan (DIOVAN) 160 mg tablet, TAKE 1 TABLET BY MOUTH ONCE DAILY (Patient taking differently: Take 160 mg by mouth nightly. ), Disp: 90 tablet, Rfl: 3  ?  ALPRAZolam (XANAX) 0.5 mg tablet, Take 1 tablet (0.5 mg total) by mouth nightly as needed for anxiety. (Patient not taking: Reported on 02/16/2020), Disp: 30 tablet, Rfl: 0  ?  docusate sodium (COLACE) 100 mg capsule, Take 1 capsule (100 mg total) by mouth daily. (Patient not taking: Reported on 02/16/2020), Disp: 30 capsule, Rfl: 0    Allergy:   Allergies   Allergen Reactions   ? Amoxicillin Nausea And Vomiting       Social:   Social History     Socioeconomic History   ? Marital status: Married     Spouse name: Not on file   ? Number of children: Not on file   ? Years of education: Not on file   ? Highest education level: Not on file   Occupational History   ? Not on file   Tobacco Use   ? Smoking status: Never Smoker   ? Smokeless tobacco: Never Used   Vaping Use   ? Vaping Use: Never used   Substance and Sexual Activity   ? Alcohol use: Yes     Comment: 5 DRINKS A WEEK   ? Drug use: Never   ? Sexual activity: Yes   Other Topics Concern   ? Not on file   Social History Narrative   ? Not on file     Social Determinants of Health     Financial Resource  Strain:    ? Difficulty of Paying Living Expenses:    Food Insecurity:    ? Worried About Programme researcher, broadcasting/film/video in the Last Year:    ? Barista in the Last Year:    Transportation Needs:    ? Freight forwarder (Medical):    ? Lack of Transportation (Non-Medical):    Physical Activity:    ? Days of Exercise per Week:    ? Minutes of Exercise per Session:    Stress:    ? Feeling of Stress :    Social Connections:    ? Frequency of Communication with Friends and Family:    ? Frequency of Social Gatherings with Friends and Family:    ? Attends Religious Services:    ? Active Member of Clubs or Organizations:    ? Attends Banker Meetings:    ? Marital Status:    Intimate Partner Violence:    ? Fear of Current or Ex-Partner:    ? Emotionally Abused:    ? Physically Abused:    ? Sexually Abused: Family:   Family History   Problem Relation Age of Onset   ? Kidney cancer Father    ? Hypertension Father    ? Heart failure Paternal Grandmother    ? Cancer Paternal Grandfather        Exam:   Vitals:    02/16/20 0908   BP: 121/84   Pulse: 65   Resp: 18   Temp: 97.8 ?F (36.6 ?C)      Body mass index is 28.48 kg/m?Marland Kitchen  Constitutional & General: Normal development and no deformities. No gross nutritional deficits. Normal habitus.  Well groomed. Well-appearing.    Labs:   Lab Results   Component Value Date    WBC 6.83 09/30/2019    HGB 13.5 09/30/2019    HCT 40.8 09/30/2019    MCV 92.7 09/30/2019    PLT 220 09/30/2019       Chemistry        Component Value Date/Time    NA 137 09/30/2019 0321    K 3.5 09/30/2019 0321    CL 105 09/30/2019 0321    CO2 28 09/30/2019 0321    BUN 14 09/30/2019 0321    CREATININE 0.94 09/30/2019 0321    GLU 110 09/30/2019 0321    PROT 7.6 09/19/2019 0949        Component Value Date/Time    CALCIUM 8.6 09/30/2019 0321    ALKPHOS 68 09/19/2019 0949    AST 23 09/19/2019 0949    AST 21 07/10/2014 0816    ALT 43 09/19/2019 0949    ALT 33 07/10/2014 0816    BILITOT 0.6 09/19/2019 0949    ALBUMIN 4.0 09/19/2019 0949          Lab Results   Component Value Date/Time    PSA 1.020 08/01/2019 08:56 AM    PSA 0.770 07/20/2018 02:00 PM    PSA 0.760 04/07/2018 08:40 AM    PSA 0.720 10/05/2016 09:03 AM     Imaging: I reviewed the images and radiologist's report of the following:  MRI Abdomen w wo IV Contrast    Result Date: 01/30/2020  1. There is postoperative change in the lateral left kidney. Multiple bilateral T2 high signal renal cysts do not appear significant changed from prior MRI. 2. Multiple sub-1 cm peripheral splenic cysts do not appear significant change from prior study. 3. There is mild splenomegaly. Reported and  signed by:  Alinda Money, MD       Impression: Philip [name] is a 60 y.o. gentleman with renal cancer post partial nephrectomy    Plan:    - Renal cancer - I personally reviewed the results of the imaging/scan with the patient. We discussed the site of operation has healed well, as well as resolution of his postoperative fluid collection.. Cysts appear to be the same size, which all appear to be simple and not immediately concerning. We also discussed the splenic enlargement, which I explained could be the result of a variety of reasons, most of them non-concerning.  Will notify his primary care physician. Also ordered CBC and CMP, in addition to another MRI to be completed prior to his next visit.     Given he is recovering well and condition remains stable, as well as the low-grade nature of his pathology with very low risk of recurrence I advised we switch to annual follow ups. Patient is in agreement with this plan. Schedule next follow up for 1 year from now.     - Prostate cancer screening - PSA reviewed from July last year which was normal. He should continue regular prostate cancer screening with his PCP.     Yisroel Ramming, MD  Assistant Professor of Urology  [facility]    [address]  [address], [address]  [place], [place]  [place] [phone]  F [phone]  02/16/2020    Scribed for Yisroel Ramming, MD by Cecile Hearing, medical scribe February 16, 2020    The documentation recorded by the scribe accurately reflects the services I personally performed and the decisions made by me. I reviewed and confirmed all material entered and/or pre-charted by the scribe.

## 2020-04-22 MED ORDER — VALSARTAN 160 MG TABLET
160 | ORAL_TABLET | Freq: Every day | ORAL | 4 refills | 90.00000 days | Status: AC
Start: 2020-04-22 — End: 2021-05-05

## 2020-05-15 MED ORDER — PRAVASTATIN 40 MG TABLET
40 | ORAL_TABLET | Freq: Every evening | ORAL | 4 refills | 90.00000 days | Status: AC
Start: 2020-05-15 — End: 2020-09-12

## 2020-08-13 ENCOUNTER — Inpatient Hospital Stay: Admit: 2020-08-13 | Discharge: 2020-08-13 | Payer: BLUE CROSS/BLUE SHIELD

## 2020-08-13 DIAGNOSIS — E782 Mixed hyperlipidemia: Secondary | ICD-10-CM

## 2020-08-13 DIAGNOSIS — I498 Other specified cardiac arrhythmias: Secondary | ICD-10-CM

## 2020-08-13 DIAGNOSIS — Z8774 Personal history of (corrected) congenital malformations of heart and circulatory system: Secondary | ICD-10-CM

## 2020-08-13 DIAGNOSIS — I10 Essential (primary) hypertension: Secondary | ICD-10-CM

## 2020-08-13 LAB — LIPID PANEL
BKR CHOLESTEROL: 230 mg/dL — ABNORMAL HIGH
BKR HDL CHOLESTEROL: 43 mg/dL
BKR LDL CHOLESTEROL SAMPSON CALCULATED: 151 mg/dL — ABNORMAL HIGH
BKR TRIGLYCERIDES: 197 mg/dL — ABNORMAL HIGH

## 2020-08-15 NOTE — Other
Chol above goal. Has FU in August with Dr. Clide Deutscher- will discuss optimizing therapy at that time.

## 2020-09-06 ENCOUNTER — Ambulatory Visit: Admit: 2020-09-06 | Payer: BLUE CROSS/BLUE SHIELD

## 2020-09-10 ENCOUNTER — Inpatient Hospital Stay: Admit: 2020-09-10 | Discharge: 2020-09-10 | Payer: BLUE CROSS/BLUE SHIELD

## 2020-09-10 ENCOUNTER — Inpatient Hospital Stay: Admit: 2020-09-10 | Discharge: 2020-09-10 | Disposition: A | Discharge status: Home / Self Care | Payer: Self-pay

## 2020-09-10 DIAGNOSIS — E782 Mixed hyperlipidemia: Secondary | ICD-10-CM

## 2020-09-10 DIAGNOSIS — I10 Essential (primary) hypertension: Secondary | ICD-10-CM

## 2020-09-10 DIAGNOSIS — I498 Other specified cardiac arrhythmias: Secondary | ICD-10-CM

## 2020-09-10 DIAGNOSIS — Z8774 Personal history of (corrected) congenital malformations of heart and circulatory system: Secondary | ICD-10-CM

## 2020-09-12 MED ORDER — ATORVASTATIN 40 MG TABLET
40 | ORAL_TABLET | Freq: Every day | ORAL | 4 refills | 90.00000 days | Status: AC
Start: 2020-09-12 — End: ?

## 2020-11-29 ENCOUNTER — Inpatient Hospital Stay: Admit: 2020-11-29 | Discharge: 2020-11-29 | Payer: BLUE CROSS/BLUE SHIELD

## 2020-11-29 DIAGNOSIS — E785 Hyperlipidemia, unspecified: Secondary | ICD-10-CM

## 2020-11-29 DIAGNOSIS — I498 Other specified cardiac arrhythmias: Secondary | ICD-10-CM

## 2020-11-29 DIAGNOSIS — E782 Mixed hyperlipidemia: Secondary | ICD-10-CM

## 2020-11-29 DIAGNOSIS — Z125 Encounter for screening for malignant neoplasm of prostate: Secondary | ICD-10-CM

## 2020-11-29 DIAGNOSIS — Z8774 Personal history of (corrected) congenital malformations of heart and circulatory system: Secondary | ICD-10-CM

## 2020-11-29 DIAGNOSIS — Q2112 Patent foramen ovale: Secondary | ICD-10-CM

## 2020-11-29 DIAGNOSIS — E559 Vitamin D deficiency, unspecified: Secondary | ICD-10-CM

## 2020-11-29 DIAGNOSIS — I10 Essential (primary) hypertension: Secondary | ICD-10-CM

## 2020-11-29 DIAGNOSIS — Z0000 Encounter for general adult medical examination without abnormal findings: Secondary | ICD-10-CM

## 2020-11-29 LAB — COMPREHENSIVE METABOLIC PANEL
BKR ALANINE AMINOTRANSFERASE (ALT): 37 U/L (ref 16–61)
BKR ALBUMIN: 3.9 g/dL (ref 3.4–5.0)
BKR ALKALINE PHOSPHATASE: 87 U/L (ref 45–117)
BKR ANION GAP (LM): 4 mmol/L — ABNORMAL LOW (ref 5–15)
BKR ASPARTATE AMINOTRANSFERASE (AST): 24 U/L (ref 15–37)
BKR BILIRUBIN TOTAL: 0.9 mg/dL (ref 39.0–<1.0)
BKR BLOOD UREA NITROGEN: 14 mg/dL (ref 7–18)
BKR CALCIUM: 8.9 mg/dL (ref 8.5–10.1)
BKR CHLORIDE: 105 mmol/L (ref 98–107)
BKR CO2: 27 mmol/L (ref 21–32)
BKR EGFR, CREATININE (CKD-EPI 2021): >60 mL/min/1.73m2 (ref >=60–1.4)
BKR GLOBULIN: 3.7 g/dL (ref 2.5–5.0)
BKR GLUCOSE: 93 mg/dL (ref 65–110)
BKR POTASSIUM: 4.8 mmol/L (ref 3.5–5.1)
BKR PROTEIN TOTAL: 7.6 g/dL (ref 6.4–8.2)
BKR SODIUM: 136 mmol/L (ref 136–145)

## 2020-11-29 LAB — CBC WITH AUTO DIFFERENTIAL
BKR CREATININE: 30.7 pg (ref 27.0–33.0)
BKR WAM ABSOLUTE IMMATURE GRANULOCYTES.: 0.01 x 1000/??L (ref 0.00–0.30)
BKR WAM ABSOLUTE LYMPHOCYTE COUNT.: 1.74 x 1000/??L (ref 0.60–3.70)
BKR WAM ABSOLUTE NEUTROPHIL COUNT.: 1.82 x 1000/??L — ABNORMAL LOW (ref 2.00–7.60)
BKR WAM BASOPHIL ABSOLUTE COUNT.: 0.04 x 1000/??L (ref 0.00–1.00)
BKR WAM BASOPHILS: 0.9 % (ref 0.0–1.4)
BKR WAM EOSINOPHIL ABSOLUTE COUNT.: 0.15 x 1000/??L (ref 0.00–1.00)
BKR WAM EOSINOPHILS: 3.6 % (ref 0.0–5.0)
BKR WAM HEMATOCRIT (2 DEC): 43.7 % (ref 38.50–50.00)
BKR WAM HEMOGLOBIN: 14.6 g/dL (ref 13.2–17.1)
BKR WAM IMMATURE GRANULOCYTES: 0.2 % (ref 0.0–1.0)
BKR WAM LYMPHOCYTES: 41.2 % (ref 17.0–50.0)
BKR WAM MCH PG: 30.7 pg (ref 27.0–33.0)
BKR WAM MCHC: 33.4 g/dL (ref 31.0–36.0)
BKR WAM MCV: 92 fL (ref 80.0–100.0)
BKR WAM MONOCYTE ABSOLUTE COUNT.: 0.46 x 1000/??L (ref 0.00–1.00)
BKR WAM MONOCYTES: 10.9 % (ref 4.0–12.0)
BKR WAM MPV: 10.2 fL (ref 8.0–12.0)
BKR WAM NEUTROPHILS: 43.2 % (ref 39.0–72.0)
BKR WAM NUCLEATED RED BLOOD CELLS: 0 % (ref 0.0–1.0)
BKR WAM PLATELETS: 249 x1000/??L (ref 150–420)
BKR WAM RDW-CV: 12.8 % (ref 11.0–15.0)
BKR WAM RED BLOOD CELL COUNT.: 4.75 M/??L (ref 4.00–6.00)
BKR WAM WHITE BLOOD CELL COUNT: 4.2 x1000/??L (ref 4.0–11.0)

## 2020-11-29 LAB — LIPID PANEL
BKR CHOLESTEROL: 183 mg/dL
BKR HDL CHOLESTEROL: 47 mg/dL
BKR LDL CHOLESTEROL SAMPSON CALCULATED: 116 mg/dL — ABNORMAL HIGH
BKR TRIGLYCERIDES: 111 mg/dL

## 2020-11-29 LAB — PSA, TOTAL (SCREENING) (BH GH L LMW YH): BKR PROSTATE SPECIFIC ANTIGEN, SCREENING: 0.91 ng/mL (ref <4.000)

## 2020-11-29 LAB — VITAMIN D, 25-HYDROXY: BKR VITAMIN D 25-HYDROXY: 52.4 ng/mL (ref 30.1–100)

## 2020-11-29 LAB — TSH W/REFLEX TO FT4     (BH GH LMW Q YH): BKR THYROID STIMULATING HORMONE (LM): 0.94 u[IU]/mL (ref 0.36–3.74)

## 2020-12-03 NOTE — Other
Labs reviewed with patient via mychart.

## 2021-02-07 ENCOUNTER — Encounter: Admit: 2021-02-07 | Payer: PRIVATE HEALTH INSURANCE | Attending: Family Medicine

## 2021-02-11 ENCOUNTER — Encounter: Admit: 2021-02-11 | Payer: PRIVATE HEALTH INSURANCE | Attending: Urology

## 2021-02-11 ENCOUNTER — Inpatient Hospital Stay: Admit: 2021-02-11 | Discharge: 2021-02-11 | Payer: BLUE CROSS/BLUE SHIELD

## 2021-02-11 ENCOUNTER — Telehealth: Admit: 2021-02-11 | Payer: PRIVATE HEALTH INSURANCE | Attending: Urology

## 2021-02-11 DIAGNOSIS — C649 Malignant neoplasm of unspecified kidney, except renal pelvis: Secondary | ICD-10-CM

## 2021-02-11 NOTE — Telephone Encounter
-----   Message from Toney Rakes, RN sent at 02/11/2021  9:49 AM EST -----Regarding: FW: reschedule MRIContact: [phone] you please reorder MRI for patient. MRI set to expire this week. He needs new order placed so he can schedule. ----- Message -----From: Cam Hai: 02/11/2021   9:41 AM ESTTo: [facility] Nurse PoolSubject: reschedule MRI                               Dr Genice Rouge,  I unfortunately tested positive for Covid this morning and had to cancel my MRI appt and I need to reschedule but the appt is only good through the 21st of this month and scheduling cannot fit me in.  Can you please request a new MRI for me at [facility] so I can schedule myself in the next 2 weeks?  also I will need to move my follow up appt that I have with you since the MRI is delayed.  Thanks

## 2021-02-11 NOTE — Telephone Encounter
Mri reordered

## 2021-02-14 ENCOUNTER — Ambulatory Visit: Admit: 2021-02-14 | Payer: PRIVATE HEALTH INSURANCE | Attending: Urology

## 2021-02-21 ENCOUNTER — Ambulatory Visit: Admit: 2021-02-21 | Payer: PRIVATE HEALTH INSURANCE | Attending: Urology

## 2021-03-04 ENCOUNTER — Inpatient Hospital Stay: Admit: 2021-03-04 | Discharge: 2021-03-04 | Payer: BLUE CROSS/BLUE SHIELD

## 2021-03-04 ENCOUNTER — Inpatient Hospital Stay: Admit: 2021-03-04 | Discharge: 2021-03-04 | Disposition: A | Discharge status: Home / Self Care | Payer: Self-pay

## 2021-03-04 DIAGNOSIS — C649 Malignant neoplasm of unspecified kidney, except renal pelvis: Secondary | ICD-10-CM

## 2021-03-04 MED ORDER — SODIUM CHLORIDE 0.9 % BOLUS (NEW BAG)
0.9 % | Freq: Once | INTRAVENOUS | Status: CP | PRN
Start: 2021-03-04 — End: ?
  Administered 2021-03-04: 16:00:00 0.9 mL/h via INTRAVENOUS

## 2021-03-04 MED ORDER — GADOTERATE MEGLUMINE 0.5 MMOL/ML (376.9 MG/ML) INTRAVENOUS SOLUTION
0.5 mmol/mL (376.9 mg/mL) | Freq: Once | INTRAVENOUS | Status: CP | PRN
Start: 2021-03-04 — End: ?
  Administered 2021-03-04: 16:00:00 0.5 mL via INTRAVENOUS

## 2021-03-12 ENCOUNTER — Encounter: Admit: 2021-03-12 | Payer: PRIVATE HEALTH INSURANCE | Attending: Urology

## 2021-03-12 ENCOUNTER — Ambulatory Visit: Admit: 2021-03-12 | Payer: BLUE CROSS/BLUE SHIELD | Attending: Urology

## 2021-03-12 DIAGNOSIS — C499 Malignant neoplasm of connective and soft tissue, unspecified: Secondary | ICD-10-CM

## 2021-03-12 DIAGNOSIS — I499 Cardiac arrhythmia, unspecified: Secondary | ICD-10-CM

## 2021-03-12 DIAGNOSIS — Q2112 PFO (patent foramen ovale): Secondary | ICD-10-CM

## 2021-03-12 DIAGNOSIS — E7800 Pure hypercholesterolemia, unspecified: Secondary | ICD-10-CM

## 2021-03-12 DIAGNOSIS — I10 Essential (primary) hypertension: Secondary | ICD-10-CM

## 2021-03-12 DIAGNOSIS — K219 Gastro-esophageal reflux disease without esophagitis: Secondary | ICD-10-CM

## 2021-03-12 DIAGNOSIS — I639 Cerebral infarction, unspecified: Secondary | ICD-10-CM

## 2021-03-12 DIAGNOSIS — C801 Malignant (primary) neoplasm, unspecified: Secondary | ICD-10-CM

## 2021-03-12 DIAGNOSIS — N2889 Other specified disorders of kidney and ureter: Secondary | ICD-10-CM

## 2021-03-14 NOTE — Progress Notes
Philip [name] + [place] [name]	Urology Office NoteDate of visit: 2/15/2023Followup for: Renal cancerHistory of Present Illness: Philip [name] is a 61 y.o. gentleman with family history of renal cell carcinoma, prior stroke, gastroesophageal reflux, hypertension?initially?referred for renal cysts seen on ultrasound. ?He was sent for MRI to better characterize the area in question?which demonstrated a?1.8 centimeter complex cystic mass in the posterior aspect of the left kidney. He underwent partial nephrectomy on 09/25/2019. He presents today for follow up to review MRI results. 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  aspirin 81 mg EC delayed release tablet, Take 1 tablet (81 mg total) by mouth daily., Disp: , Rfl: ?  atorvastatin (LIPITOR) 40 mg tablet, Take 1 tablet (40 mg total) by mouth daily., Disp: 90 tablet, Rfl: 3?  ketoconazole (NIZORAL) 2 % cream, Apply 1 Application topically 2 (two) times daily as needed. To rash on scalp and face, Disp: , Rfl: ?  triamcinolone (KENALOG) 0.025 % cream, Apply 1 Application topically 2 (two) times daily as needed. To rash on face up to 2 weeks a month for flare ups, Disp: , Rfl: ?  valsartan (DIOVAN) 160 mg tablet, Take 1 tablet (160 mg total) by mouth daily., Disp: 90 tablet, Rfl: 3Allergy: Allergies Allergen Reactions ? Amoxicillin Nausea And Vomiting Social: Social History Socioeconomic History ? Marital status: Married   Spouse name: Not on file ? Number of children: Not on file ? Years of education: Not on file ? Highest education level: Not on file Occupational History ? Not on file Tobacco Use ? Smoking status: Never ? Smokeless tobacco: Never Vaping Use ? Vaping Use: Never used Substance and Sexual Activity ? Alcohol use: Yes   Comment: 5 DRINKS A WEEK ? Drug use: Never ? Sexual activity: Yes Other Topics Concern ? Not on file Social History Narrative ? Not on file Social Determinants of Health Financial Resource Strain: Not on file Food Insecurity: Not on file Transportation Needs: Not on file Physical Activity: Not on file Stress: Not on file Social Connections: Not on file Intimate Partner Violence: Not on file Housing Stability: Not on file Family: Family History Problem Relation Age of Onset ? Kidney cancer Father  ? Hypertension Father  ? Heart failure Paternal Grandmother  ? Cancer Paternal Grandfather  Exam: Vitals:  03/12/21 1125 Temp: 97.5 ?F (36.4 ?C)  Body mass index is 29.57 kg/m?Marland KitchenConstitutional & General: Normal development and no deformities. No gross nutritional deficits. Normal habitus.  Well groomed. Well-appearing.Labs: Lab Results Component Value Date  WBC 4.2 11/29/2020  HGB 14.6 11/29/2020  HCT 43.70 11/29/2020  MCV 92.0 11/29/2020  PLT 249 11/29/2020   Chemistry    Component Value Date/Time  NA 136 11/29/2020 1007  K 4.8 11/29/2020 1007  CL 105 11/29/2020 1007  CO2 27 11/29/2020 1007  BUN 14 11/29/2020 1007  CREATININE 1.08 11/29/2020 1007  GLU 93 11/29/2020 1007  PROT 7.6 11/29/2020 1007    Component Value Date/Time  CALCIUM 8.9 11/29/2020 1007  ALKPHOS 87 11/29/2020 1007  AST 24 11/29/2020 1007  AST 21 07/10/2014 0816  ALT 37 11/29/2020  1007  ALT 33 07/10/2014 0816  BILITOT 0.9 11/29/2020 1007  ALBUMIN 3.9 11/29/2020 1007  Lab Results Component Value Date/Time  PSA 0.910 11/29/2020 10:07 AM  PSA 1.020 08/01/2019 08:56 AM  PSA 0.770 07/20/2018 02:00 PM  PSA 0.760 04/07/2018 08:40 AM  PSA 0.720 10/05/2016 09:03 AM Imaging: I reviewed the images and radiologist's report of the following:MRI Abdomen w wo IV ContrastResult Date: 03/04/2021 Prior postoperative alterations at the posterior interpolar left kidney without recurrent abnormal soft tissue unchanged. Stable multiple renal cysts without solid enhancing or enlarging renal lesion. No obstructive uropathy. Small stable splenic cysts. Enlarged spleen maximally 14.2 cm. No interval change Reported and Signed by:  Waverly Ferrari, MD Impression: Philip [name] is a 61 y.o. gentleman with renal mass post partial nephrectomyPlan:  - renal mass-MRI results reviewed demonstrating no evidence of residual disease.  He does have known renal cysts bilaterally which at this point to not raise any immediate concern.  We again reviewed the results of his pathology which demonstrated a mass of low malignant potential, not definitively classified as a malignancy.- [name], MDAssistant Professor of [facility] of [address], [place] [phone]/15/2023Scribed for Vivien Rossetti, MD by Cecile Hearing, medical scribe March 12, 2021  The documentation recorded by the scribe accurately reflects the services I personally performed and the decisions made by me. I reviewed and confirmed all material entered and/or pre-charted by the scribe.

## 2021-04-30 ENCOUNTER — Encounter: Admit: 2021-04-30 | Payer: PRIVATE HEALTH INSURANCE | Attending: Family Medicine

## 2021-05-04 ENCOUNTER — Encounter: Admit: 2021-05-04 | Payer: PRIVATE HEALTH INSURANCE | Attending: Family Medicine

## 2021-05-05 MED ORDER — VALSARTAN 160 MG TABLET
160 | ORAL_TABLET | Freq: Every day | ORAL | 1 refills | 90.00000 days | Status: AC
Start: 2021-05-05 — End: 2021-06-09

## 2021-06-07 ENCOUNTER — Encounter: Admit: 2021-06-07 | Payer: PRIVATE HEALTH INSURANCE | Attending: Family Medicine

## 2021-06-09 MED ORDER — VALSARTAN 160 MG TABLET
160 | ORAL_TABLET | Freq: Every day | ORAL | 5 refills | 90.00000 days | Status: AC
Start: 2021-06-09 — End: ?

## 2021-09-07 ENCOUNTER — Encounter: Admit: 2021-09-07 | Payer: PRIVATE HEALTH INSURANCE | Attending: Cardiovascular Disease

## 2021-09-08 ENCOUNTER — Ambulatory Visit: Payer: BLUE CROSS/BLUE SHIELD | Admitting: Family Medicine

## 2021-09-08 ENCOUNTER — Encounter: Payer: Self-pay | Admitting: Family Medicine

## 2021-09-08 ENCOUNTER — Other Ambulatory Visit: Payer: Self-pay

## 2021-09-08 VITALS — BP 124/84 | HR 82 | Ht 72.5 in | Wt 225.2 lb

## 2021-09-08 DIAGNOSIS — R008 Other abnormalities of heart beat: Secondary | ICD-10-CM

## 2021-09-08 DIAGNOSIS — Z125 Encounter for screening for malignant neoplasm of prostate: Secondary | ICD-10-CM

## 2021-09-08 DIAGNOSIS — C499 Malignant neoplasm of connective and soft tissue, unspecified: Secondary | ICD-10-CM

## 2021-09-08 DIAGNOSIS — Z85528 Personal history of other malignant neoplasm of kidney: Secondary | ICD-10-CM

## 2021-09-08 DIAGNOSIS — N289 Disorder of kidney and ureter, unspecified: Secondary | ICD-10-CM

## 2021-09-08 DIAGNOSIS — Q2112 Patent foramen ovale: Secondary | ICD-10-CM

## 2021-09-08 DIAGNOSIS — Z905 Acquired absence of kidney: Secondary | ICD-10-CM

## 2021-09-08 DIAGNOSIS — I10 Essential (primary) hypertension: Secondary | ICD-10-CM

## 2021-09-08 HISTORY — DX: Other abnormalities of heart beat: R00.8

## 2021-09-08 NOTE — Progress Notes (Signed)
Outpatient Progress Note    SUBJECTIVE    Harry Fleming is a 61 y.o. male who presents for New Patient Visit      Hypertension  Patient has a history of PFO, hypertension, and trigeminy.  Patient is exercising and tries to be adherent to a low-salt diet.  Patient denies: chest pain, chest pressure/discomfort, claudication, dyspnea, exertional chest pressure/discomfort, lower extremity edema, near-syncope, palpitations, syncope and tachypnea. Cardiovascular risk factors: advanced age (older than 55 for men, 38 for women), hypertension and male gender. Use of agents associated with hypertension: none.  Patient is currently on valsartan 160 mg daily and atorvastatin 40 mg daily.    History of renal cell carcinoma  Patient has a history of renal cell carcinoma status post partial nephrectomy completed in [place].  Patient also has multiple renal lesions on bilateral kidneys which are being monitored yearly by getting MRIs.  Patient also has a history of leiomyosarcoma of the shoulder.    Patient's medications were reviewed in E-record today    Patient Active Problem List    Diagnosis Date Noted   ? History of renal cell carcinoma 09/08/2021   ? Hypertension 09/08/2021   ? Renal lesion 09/08/2021   ? 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 H/O partial nephrectomy 09/08/2021     Current Outpatient Medications   Medication Sig   ? valsartan (DIOVAN) 160 MG tablet Take 1 tablet (160 mg total) by mouth daily   ? atorvastatin (LIPITOR) 40 mg tablet Take 1 tablet (40 mg total) by mouth daily   ? aspirin 81 mg EC tablet Take 1 tablet (81 mg total) by mouth daily     Past Medical History:   Diagnosis Date   ? History of renal cell carcinoma    ? Hypertension    ? Leiomyosarcoma     Shoulder   ? PFO (patent foramen ovale)    ? Renal lesion       Past Surgical History:   Procedure Laterality Date   ? PARTIAL NEPHRECTOMY       Allergies   Allergen Reactions   ? Amoxicillin Nausea And Vomiting   ? Cat Dander Other (See Comments)     Eyes swell.       Social History     Socioeconomic History   ? Marital status: Single   Tobacco Use   ? Smoking status: Never   ? Smokeless tobacco: Never   Substance and Sexual Activity   ? Alcohol use: Yes     Alcohol/week: 5.0 standard drinks of alcohol     Types: 5 Shots of liquor per week     Comment: 5 glasses of bourbon a week.   ? Drug use: Never   ? Sexual activity: Yes     Partners: Female     Birth control/protection: None        Family History   Problem Relation Age of Onset   ? Cancer Father         Renal cell carcinoma       OBJECTIVE    BP 124/84   Pulse 82   Ht 1.842 m (6' 0.5")   Wt 102.2 kg (225 lb 3.2 oz)   SpO2 97%   BMI 30.12 kg/m?   GEN: Pleasant and cooperative in no apparent distress.  HEENT: NCAT, sclera normal, Lids non-drooping  Cardiovascular: Normal rate and regular rhythm, S1S2, no murmurs, rubs, or gallops. No carotid bruits.  Pulmonary/Chest: Effort normal and breath sounds normal, no wheezes, rales,  or rhonchi.   Neuro: alert, oriented   speech: normal in context and clarity  no involuntary movements or tremors  gait: normal  Affect & Behavior:  normal perception and normal reasoning          All labs from the past 14 days   No visits with results within 14 Day(s) from this visit.   Latest known visit with results is:   No results found for any previous visit.       ASSESSMENT & PLAN    1. PFO (patent foramen ovale)  [facility] RE[facility]ERRAL [facility]    CBC    Comprehensive metabolic panel    Hemoglobin A1c    Lipid Panel (Reflex to Direct  LDL if Triglycerides more than 400)    TSH    Vitamin D    PSA (eff.04-2008)      2. History of renal cell carcinoma  AMB REFERRAL [facility]    CBC    Comprehensive metabolic panel    Hemoglobin A1c    Lipid Panel (Reflex to Direct  LDL if Triglycerides more than 400)    TSH    Vitamin D    PSA (eff.04-2008)      3. Hypertension  AMB REFERRAL [facility]    CBC    Comprehensive metabolic panel    Hemoglobin  U9W    Lipid Panel (Reflex to Direct  LDL if Triglycerides more than 400)    TSH    Vitamin D    PSA (eff.04-2008)  Continue valsartan 160 mg daily  Lifestyle modifications: weight reduction, discussed DASH eating plan, discussed dietary sodium reduction, discussed aerobic physical activity, discussed moderation of alcohol consumption  Check blood pressures daily and record.       4. Renal lesion  AMB REFERRAL [facility]    CBC    Comprehensive metabolic panel    Hemoglobin A1c    Lipid Panel (Reflex to Direct  LDL if Triglycerides more than 400)    TSH    Vitamin D    PSA (eff.04-2008)      5. Trigeminy  AMB REFERRAL [facility]      6. Leiomyosarcoma  AMB REFERRAL TO [facility]MAT[facility]Y [facility]      7. H/O partial nephrectomy  AMB REFERRAL [facility]      8. Screening PSA (prostate specific antigen)  PSA (eff.04-2008)        Further follow-up plans will be based on outcome of lab/imaging studies; see orders.  If any symptoms that warrant further treatment RTC   Follow up in about 10 months (around 07/10/2022) for physical . Patient verbalized understanding of information presented and confirmed agreement with current plan of care. Reviewed risks, benefits, and administration of prescribed/refilled medications. Precautions reviewed

## 2021-09-24 ENCOUNTER — Encounter: Payer: Self-pay | Admitting: Family Medicine

## 2021-09-24 DIAGNOSIS — M75100 Unspecified rotator cuff tear or rupture of unspecified shoulder, not specified as traumatic: Secondary | ICD-10-CM

## 2021-09-24 DIAGNOSIS — M25511 Pain in right shoulder: Secondary | ICD-10-CM

## 2021-09-30 ENCOUNTER — Encounter: Payer: Self-pay | Admitting: Family Medicine

## 2021-09-30 ENCOUNTER — Ambulatory Visit
Admission: RE | Admit: 2021-09-30 | Discharge: 2021-09-30 | Disposition: A | Discharge status: Home / Self Care | Payer: BLUE CROSS/BLUE SHIELD | Source: Ambulatory Visit | Attending: Family Medicine | Admitting: Family Medicine

## 2021-09-30 ENCOUNTER — Other Ambulatory Visit: Payer: Self-pay

## 2021-09-30 DIAGNOSIS — M19011 Primary osteoarthritis, right shoulder: Secondary | ICD-10-CM

## 2021-09-30 DIAGNOSIS — M25511 Pain in right shoulder: Secondary | ICD-10-CM | POA: Insufficient documentation

## 2021-09-30 MED ORDER — OXYCODONE HCL 5 MG PO TABS *I*
5.0000 mg | ORAL_TABLET | Freq: Two times a day (BID) | ORAL | 0 refills | Status: DC | PRN
Start: 2021-09-30 — End: 2021-10-09

## 2021-09-30 NOTE — Addendum Note (Signed)
Addended byGeorgia Lopes on: 09/30/2021 05:13 PM     Modules accepted: Orders

## 2021-10-06 MED ORDER — VALSARTAN 160 MG PO TABS *I*
160.0000 mg | ORAL_TABLET | Freq: Every day | ORAL | 1 refills | Status: DC
Start: 2021-10-06 — End: 2022-05-08

## 2021-10-07 ENCOUNTER — Other Ambulatory Visit
Admission: RE | Admit: 2021-10-07 | Discharge: 2021-10-07 | Disposition: A | Discharge status: Home / Self Care | Payer: BLUE CROSS/BLUE SHIELD | Source: Ambulatory Visit | Attending: Family Medicine | Admitting: Family Medicine

## 2021-10-07 DIAGNOSIS — N289 Disorder of kidney and ureter, unspecified: Secondary | ICD-10-CM | POA: Insufficient documentation

## 2021-10-07 DIAGNOSIS — Q2112 Patent foramen ovale: Secondary | ICD-10-CM | POA: Insufficient documentation

## 2021-10-07 DIAGNOSIS — Z125 Encounter for screening for malignant neoplasm of prostate: Secondary | ICD-10-CM | POA: Insufficient documentation

## 2021-10-07 DIAGNOSIS — Z85528 Personal history of other malignant neoplasm of kidney: Secondary | ICD-10-CM | POA: Insufficient documentation

## 2021-10-07 DIAGNOSIS — I10 Essential (primary) hypertension: Secondary | ICD-10-CM | POA: Insufficient documentation

## 2021-10-07 LAB — TSH: TSH: 1.14 u[IU]/mL (ref 0.27–4.20)

## 2021-10-07 LAB — LIPID PANEL
Chol/HDL Ratio: 4.1
Cholesterol: 184 mg/dL
HDL: 45 mg/dL (ref 40–60)
LDL Calculated: 114 mg/dL
Non HDL Cholesterol: 139 mg/dL
Triglycerides: 124 mg/dL

## 2021-10-07 LAB — COMPREHENSIVE METABOLIC PANEL
ALT: 44 U/L (ref 0–50)
AST: 32 U/L (ref 0–50)
Albumin: 4.5 g/dL (ref 3.5–5.2)
Alk Phos: 98 U/L (ref 40–130)
Anion Gap: 12 (ref 7–16)
Bilirubin,Total: 0.5 mg/dL (ref 0.0–1.2)
CO2: 26 mmol/L (ref 20–28)
Calcium: 9.5 mg/dL (ref 8.6–10.2)
Chloride: 101 mmol/L (ref 96–108)
Creatinine: 1.06 mg/dL (ref 0.67–1.17)
Glucose: 90 mg/dL (ref 60–99)
Lab: 15 mg/dL (ref 6–20)
Potassium: 4.8 mmol/L (ref 3.3–5.1)
Sodium: 139 mmol/L (ref 133–145)
Total Protein: 7.3 g/dL (ref 6.3–7.7)
eGFR BY CREAT: 81 *

## 2021-10-07 LAB — CBC
Hematocrit: 44 % (ref 37–52)
Hemoglobin: 14.7 g/dL (ref 12.0–17.0)
MCH: 30 pg (ref 26–32)
MCHC: 33 g/dL (ref 32–37)
MCV: 91 fL (ref 75–100)
Platelets: 280 10*3/uL (ref 150–450)
RBC: 4.9 MIL/uL (ref 4.0–6.0)
RDW: 12.9 % (ref 0.0–15.0)
WBC: 4.3 10*3/uL (ref 3.5–11.0)

## 2021-10-07 LAB — VITAMIN D: 25-OH Vit Total: 33 ng/mL (ref 30–60)

## 2021-10-07 LAB — PSA (EFF.4-2010): PSA (eff. 4-2010): 0.88 ng/mL (ref 0.00–4.00)

## 2021-10-08 ENCOUNTER — Encounter: Payer: Self-pay | Admitting: Family Medicine

## 2021-10-08 LAB — HEMOGLOBIN A1C: Hemoglobin A1C: 6.1 % — ABNORMAL HIGH

## 2021-10-09 ENCOUNTER — Telehealth: Payer: Self-pay | Admitting: Urology

## 2021-10-09 ENCOUNTER — Other Ambulatory Visit: Payer: Self-pay

## 2021-10-09 ENCOUNTER — Encounter: Payer: Self-pay | Admitting: Orthopedic Surgery

## 2021-10-09 ENCOUNTER — Ambulatory Visit: Payer: BLUE CROSS/BLUE SHIELD | Admitting: Orthopedic Surgery

## 2021-10-09 VITALS — BP 165/89 | HR 77 | Ht 72.0 in | Wt 225.0 lb

## 2021-10-09 DIAGNOSIS — M25511 Pain in right shoulder: Secondary | ICD-10-CM

## 2021-10-09 DIAGNOSIS — M7581 Other shoulder lesions, right shoulder: Secondary | ICD-10-CM

## 2021-10-09 DIAGNOSIS — M7541 Impingement syndrome of right shoulder: Secondary | ICD-10-CM

## 2021-10-09 DIAGNOSIS — S43439A Superior glenoid labrum lesion of unspecified shoulder, initial encounter: Secondary | ICD-10-CM
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
## 2021-10-09 NOTE — Telephone Encounter (Signed)
Can do 10/5 at 10:15.     Thank you  Juliette Alcide

## 2021-10-09 NOTE — Telephone Encounter (Signed)
Copied from CRM [id number]. Topic: Appointments - Schedule Appointment  >> Oct 09, 2021 10:56 AM Sondra Come wrote:      [name] called, patient is scheduled for an NPV on:    Date & Time: 10/30 at  10:00 AM  Provider: Dr. Lucita Ferrara  Diagnosis/Symptoms: [phone] (ICD-10-CM) - History of renal cell carcinoma + N28.9 (ICD-10-CM) - Renal lesion + Z90.5 (ICD-10-CM) - H/O partial nephrectomy  Is the appointment scheduled within the outline timeframe? no  Referring provider: Georgia Lopes - PCP   Diagnoses located on Dx Chart? yes    Have records been sent? Yes  Records from referring physician must be received prior to appointment date    Has the patient requested a sooner appointment? No   Has patient been added to wait list and notified they will be contacted if a sooner appointment becomes available? yes  Is patient willing to travel to other locations for sooner appointment? yes    Does patient need a return call? Yes    Confirmed phone number: [phone];

## 2021-10-13 NOTE — Telephone Encounter (Signed)
Patient can only do Mondays.  He would prefer to keep appointment as scheduled.

## 2021-10-20 ENCOUNTER — Ambulatory Visit
Admission: RE | Admit: 2021-10-20 | Discharge: 2021-10-20 | Disposition: A | Discharge status: Home / Self Care | Payer: BLUE CROSS/BLUE SHIELD | Source: Ambulatory Visit | Attending: Orthopedic Surgery | Admitting: Orthopedic Surgery

## 2021-10-20 ENCOUNTER — Other Ambulatory Visit: Payer: Self-pay

## 2021-10-20 DIAGNOSIS — M71311 Other bursal cyst, right shoulder: Secondary | ICD-10-CM | POA: Insufficient documentation

## 2021-10-20 DIAGNOSIS — M25511 Pain in right shoulder: Secondary | ICD-10-CM

## 2021-10-20 DIAGNOSIS — M25411 Effusion, right shoulder: Secondary | ICD-10-CM | POA: Insufficient documentation

## 2021-10-21 ENCOUNTER — Encounter: Payer: Self-pay | Admitting: Cardiology

## 2021-10-21 DIAGNOSIS — E785 Hyperlipidemia, unspecified: Secondary | ICD-10-CM | POA: Insufficient documentation

## 2021-10-21 NOTE — Progress Notes (Unsigned)
[facility]        Cardiology Office Consult Note    Date of Consult: 10/21/2021 Patient: Harry Fleming   Patients PCP: Georgia Lopes, MD Patient DOB: 25-Jan-1961  EMRN: [id number]     History of Present Illness/Reason For Visit     I had the pleasure of seeing Harry Fleming in cardiology consult on 10/21/2021. Harry Fleming is an 61 y.o. male who we were asked to see to establish care.  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    Social and Family History     Family History   Problem Relation Age of Onset    Cancer Father         Renal cell carcinoma     Social History     Socioeconomic History    Marital status: Married   Tobacco Use    Smoking status: Never    Smokeless tobacco: Never   Substance and Sexual Activity    Alcohol use: Yes     Alcohol/week: 5.0 standard drinks of alcohol     Types: 5 Shots of liquor per week     Comment: 5 glasses of bourbon a week.    Drug use: Never    Sexual activity: Yes     Partners: Female     Birth control/protection: None         Review of Systems     ROS  Vitals and Physical Exam     Harry Fleming's  vitals were not taken for this visit.  There is no height or weight on file to calculate  BMI.    Physical Exam  Laboratory Data     Hematology:   Results in Past 730 Days  Result Component Current Result Previous Result   WBC 4.3 (10/07/2021) Not in Time Range   Hemoglobin 14.7 (10/07/2021) Not in Time Range   Hematocrit 44 (10/07/2021) Not in Time Range   Platelets 280 (10/07/2021) Not in Time Range     Chemistry:   Results in Past 730 Days  Result Component Current Result Previous Result   Sodium 139 (10/07/2021) Not in Time Range   Potassium 4.8 (10/07/2021) Not in Time Range   Creatinine 1.06 (10/07/2021) Not in Time Range   Glucose 90 (10/07/2021) Not in Time Range   Calcium 9.5 (10/07/2021) Not in Time Range   Hemoglobin A1C 6.1 (H) (10/07/2021) Not in Time Range   AST 32 (  10/07/2021) Not in Time Range   ALT 44 (10/07/2021) Not in Time Range   TSH 1.14 (10/07/2021) Not in Time Range     Coagulation Studies:   No results found for requested labs within last 730 days.     Cardiac:   No results found for requested labs within last 730 days.     Lipids:   Results in Past 730 Days  Result Component Current Result Previous Result   Cholesterol 184 (10/07/2021) Not in Time Range   HDL 45 (10/07/2021) Not in Time Range   Triglycerides 124 (10/07/2021) Not in Time Range   LDL Calculated 114 (10/07/2021) Not in Time Range   Chol/HDL Ratio 4.1 (10/07/2021) Not in Time Range       Cardiac/Imaging Data & Risk Scores     ECG: ***                       Impression and Plan     Patient Active Problem List   Diagnosis Code    History of renal cell carcinoma Z85.528    Hypertension I10    Renal lesion N28.9    Trigeminy R00.8    H/O partial nephrectomy Z90.5    HLD (hyperlipidemia) E78.5       This is an 61 y.o. 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           Pandora Leiter, PA  Electronically signed on 10/21/2021 at 10:16 AM.

## 2021-10-22 ENCOUNTER — Ambulatory Visit: Payer: BLUE CROSS/BLUE SHIELD | Admitting: Cardiology

## 2021-10-24 ENCOUNTER — Ambulatory Visit: Payer: BLUE CROSS/BLUE SHIELD | Admitting: Cardiology

## 2021-10-31 ENCOUNTER — Other Ambulatory Visit: Payer: Self-pay

## 2021-10-31 ENCOUNTER — Ambulatory Visit: Payer: BLUE CROSS/BLUE SHIELD | Admitting: Orthopedic Surgery

## 2021-10-31 ENCOUNTER — Encounter: Payer: Self-pay | Admitting: Orthopedic Surgery

## 2021-10-31 VITALS — BP 155/96 | HR 74 | Ht 72.05 in | Wt 225.0 lb

## 2021-10-31 DIAGNOSIS — S43439A Superior glenoid labrum lesion of unspecified shoulder, initial encounter: Secondary | ICD-10-CM

## 2021-10-31 DIAGNOSIS — M25511 Pain in right shoulder: Secondary | ICD-10-CM

## 2021-10-31 DIAGNOSIS — S43431A Superior glenoid labrum lesion of right shoulder, initial encounter: Secondary | ICD-10-CM

## 2021-10-31 DIAGNOSIS — M7581 Other shoulder lesions, right shoulder: Secondary | ICD-10-CM

## 2021-10-31 NOTE — Progress Notes (Signed)
Orthopaedic Follow Up Note    CHIEF COMPLAINT:  Harry Fleming is a 61 y.o. male who presents for follow-up evaluation.  Reason(s) for Visit: Right shoulder pain    Previous treatment at the last visit and since last visit include;   Right shoulder pain secondary to low-grade partial-thickness supraspinatus tearing patient reports to clinic for reevaluation.  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  His pain is shoulder significant improved with activity modification, rest.  He has minimal pain today and full range of motion.      ROS: Complete 12 point ROS performed, see HPI.    Vitals:    10/31/21 1339   BP: (!) 155/96   Pulse: 74   Weight: 102.1 kg (225 lb)   Height: 1.83 m (6' 0.05")       PHYSICAL EXAM:  General: He is in no acute distress.  He  is alert and oriented x 3.   Neck: Trachea midline  Respiratory: adequate effort  Cardiovascular: extremities perfused  Abdomen: non distended  Skin: no rashes    Orthopaedic Exam    Right shoulder exam, full range of motion forward flexion abduction, internal and external rotation full.  Rotator cuff testing 5-5 in all planes.  Mild impingement signs on exam in the area of his superior labrum.  No significant bony tenderness.  Neurovascular tact distally    MRI of the right shoulder demonstrates supraspinatus tendinopathy and partial-thickness tearing with no evidence of full-thickness tearing.  Downsloping acromion.  Long head of biceps tenosynovitis.  Rad read:  Findings compatible with supraspinatus musculotendinous strain with distal tendon delamination, and mild subacromial/subdeltoid bursitis.    Small acromioclavicular effusion including a small superficial synovial cyst.        ASSESSMENT/PLAN:  1. Right shoulder pain    2. Rotator cuff tendonitis, right    3. SLAP tear of shoulder    Patient has been treated with conservative management, activity modification, over-the-counter pain medications and rest and  has been doing well significant proved since last visit.  He is here for today for MRI follow-up which demonstrates mild partial-thickness tearing of the supraspinatus, downsloping acromion and long head of biceps tendinitis.    I recommend continued conservative management with physical therapy and activity modification as needed.    I will have the patient follow-up in 3 months for reevaluation.    Mabeline Caras, DO  10/31/2021   2:29 PM    Answers submitted by the patient for this visit:  Right shoulder (Submitted on 10/31/2021)  Chief Complaint: Right shoulder pain  What is your goal for today's visit?: MRI read  Handedness: Right Handed  Was this the result of an injury?: Yes  What is your pain level?: 1/10  Please describe the quality of your pain: : dull ache  What diagnostic workup have you had for this condition?: CT scan, MRI scan  What treatments have you tried for this condition?: acetaminophen, ice, physical therapy  Progression since onset: : gradually improving  Is this a work related condition? : No  Current work status: : usual activities

## 2021-11-07 ENCOUNTER — Ambulatory Visit: Payer: BLUE CROSS/BLUE SHIELD | Admitting: Orthopedic Surgery

## 2021-11-21 ENCOUNTER — Other Ambulatory Visit: Payer: Self-pay | Admitting: Urology

## 2021-11-21 DIAGNOSIS — R3989 Other symptoms and signs involving the genitourinary system: Secondary | ICD-10-CM

## 2021-11-23 NOTE — Progress Notes (Signed)
Chief complaint:  Renal cell carcinoma    History of present illness:  Harry Fleming is a 61 y.o. male with a history of PFO, HTN, trigeminy, and leiomyosarcoma of the shoulder who presents with known GU history of renal cell carcinoma s/p right partial nephrectomy performed in [place] about 2021.  He states that the mass was 1.9 cm lower pole mass.  He recently moved to the area and presents to establish care.  He also has history of known multiple lesions on bilateral kidneys which have been surveilled with MRI.  He presents today to establish Urologic care.  He denies any gross hematuria or flank pain.      Medications:   Current Outpatient Medications   Medication    valsartan (DIOVAN) 160 MG tablet    atorvastatin (LIPITOR) 40 mg tablet    aspirin 81 mg EC tablet     No current facility-administered medications for this visit.         Allergies:   Allergies   Allergen Reactions    Amoxicillin Nausea And Vomiting    Cat Dander Other (See Comments)     Eyes swell.          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    Drug use: Never    Sexual activity: Yes     Partners: Female     Birth control/protection: None           Review of Systems  Systemic:  Denies recent weight loss, weight gain, or fatigue.  Eyes: Denies change in vision.  ENT: Denies hearing problems, or loss. Denies swollen glands or stiff neck.  Chest: Denies recent cold, flu or upper respiratory illness. Denies cough or dyspnea.  Heart: Denies recent "heart trouble," chest pain, or palpitations.  GI: Denies constipation, diarrhea, acid reflux or bloody stool.  GU: Denies urogenital complaints, except as outlined in the HPI.  Musculoskeletal: Denies any muscle or joint pain, stiffness, or progressive arthritis.  Skin: Denies rash, ulcers, or other skin problems.  Neuro: Denies numbness or  Tingling of the extremities, dizziness or headaches.  Pysch: Denies depression, anxiety, suicidal thoughts.  Endocrine: Denies diabetes, hypothyroidism, hyperthyroidism, excessive thirst or urine production out of proportion to fluid intake.  Hematology: Denies recent unusual bleeding, bruising, or anemia.  Allergy/Immunology: Denies  runny nose, allergic rhinitis, or itching eyes.        Physical examination:  There were no vitals taken for this visit.    Physical Exam  Constitutional:       Appearance: He is well-developed.   HENT:      Head: Normocephalic and atraumatic.   Eyes:      Conjunctiva/sclera: Conjunctivae normal.      Pupils: Pupils are equal, round, and reactive to light.   Cardiovascular:      Rate and Rhythm: Normal rate and regular rhythm.   Pulmonary:      Effort: Pulmonary effort is normal.   Abdominal:      General: There is no distension.      Palpations: Abdomen is soft.      Tenderness: There is no abdominal tenderness.   Musculoskeletal:         General: Normal range of motion.      Cervical back: Normal range of motion and neck supple.   Skin:     General: Skin is warm and dry.   Neurological:      Mental Status: He is alert and oriented to person, place, and time.   Psychiatric:         Behavior: Behavior normal.         Thought Content: Thought content normal.         Judgment: Judgment  normal.           Imaging and Labs:  No results found.        Lab results: 10/07/21  1121   Sodium 139   Potassium 4.8   Chloride 101   CO2 26   UN 15   Creatinine 1.06   Glucose 90   Calcium 9.5             Lab results: 10/07/21  1121   WBC 4.3   Hemoglobin 14.7   Hematocrit 44   RBC 4.9   Platelets 280             Lab results: 11/24/21  1000   Glucose,UA POCT Normal   Ketones,UA POCT Negative   Specific gravity,UA POCT 1.015   Blood,UA POCT Negative   PH,UA POCT 7.0   Protein,UA POCT Negative   Nitrite,UA POCT Negative   Leuk Esterase,UA POCT Negative                 Assessment:  61 year old male with history of renal cell carcinoma s/p right partial nephrectomy and bilateral renal lesions followed by MRI      Plan:  - obtain outside hospital pathology and imaging records  - MRI abdomen renal mass protocol  - follow up with Dr. Abran Cantor for continued management  - referral for genetic counseling      Theresia Majors, MD

## 2021-11-24 ENCOUNTER — Encounter: Payer: Self-pay | Admitting: Urology

## 2021-11-24 ENCOUNTER — Ambulatory Visit: Payer: BLUE CROSS/BLUE SHIELD | Admitting: Urology

## 2021-11-24 ENCOUNTER — Telehealth: Payer: Self-pay

## 2021-11-24 ENCOUNTER — Encounter: Payer: Self-pay | Admitting: Family Medicine

## 2021-11-24 ENCOUNTER — Other Ambulatory Visit: Payer: Self-pay

## 2021-11-24 VITALS — Ht 72.05 in | Wt 225.0 lb

## 2021-11-24 DIAGNOSIS — Z85528 Personal history of other malignant neoplasm of kidney: Secondary | ICD-10-CM

## 2021-11-24 DIAGNOSIS — R3989 Other symptoms and signs involving the genitourinary system: Secondary | ICD-10-CM

## 2021-11-24 DIAGNOSIS — Z905 Acquired absence of kidney: Secondary | ICD-10-CM

## 2021-11-24 LAB — POCT URINALYSIS DIPSTICK
Blood,UA POCT: NEGATIVE
Glucose,UA POCT: NORMAL mg/dL
Ketones,UA POCT: NEGATIVE mg/dL
Leuk Esterase,UA POCT: NEGATIVE
Lot #: 65249402
Nitrite,UA POCT: NEGATIVE
PH,UA POCT: 7 (ref 5–8)
Protein,UA POCT: NEGATIVE mg/dL
Specific gravity,UA POCT: 1.015 (ref 1.002–1.030)

## 2021-11-24 NOTE — Telephone Encounter (Signed)
Spoke with patient and scheduled genetic consult.

## 2021-11-25 MED ORDER — ATORVASTATIN CALCIUM 40 MG PO TABS *I*
40.0000 mg | ORAL_TABLET | Freq: Every day | ORAL | 1 refills | Status: DC
Start: 2021-11-25 — End: 2022-06-02

## 2021-11-25 NOTE — Telephone Encounter (Signed)
Last appointment: 09/08/2021  Next appointment: Visit date not found              Lab results: 10/07/21  1121   WBC 4.3   Hemoglobin 14.7   Hematocrit 44   RBC 4.9   Platelets 280        Lab Results   Component Value Date    HA1C 6.1 (H) 10/07/2021         Lab results: 10/07/21  1121   TSH 1.14           Lab results: 10/07/21  1121   Sodium 139   Potassium 4.8   Chloride 101   CO2 26   UN 15   Creatinine 1.06   Glucose 90   Calcium 9.5   Total Protein 7.3   Albumin 4.5   ALT 44   AST 32   Alk Phos 98   Bilirubin,Total 0.5

## 2021-11-26 ENCOUNTER — Encounter: Payer: Self-pay | Admitting: Family Medicine

## 2021-11-27 ENCOUNTER — Telehealth: Payer: Self-pay | Admitting: Urology

## 2021-11-27 NOTE — Telephone Encounter (Signed)
Previous records, imaging and CD's request faxed to Dr Genice Rouge office at [phone].

## 2021-11-28 ENCOUNTER — Telehealth: Payer: Self-pay | Admitting: Cardiology

## 2021-11-28 NOTE — Telephone Encounter (Signed)
Patient is calling to reschedule NPV with Judeth Porch that was previously cancelled. Patient is rescheduled on 11/20.

## 2021-12-08 ENCOUNTER — Other Ambulatory Visit: Payer: Self-pay

## 2021-12-08 ENCOUNTER — Ambulatory Visit
Admission: RE | Admit: 2021-12-08 | Discharge: 2021-12-08 | Disposition: A | Discharge status: Home / Self Care | Payer: BLUE CROSS/BLUE SHIELD | Source: Ambulatory Visit | Attending: Urology | Admitting: Urology

## 2021-12-08 ENCOUNTER — Ambulatory Visit: Payer: BLUE CROSS/BLUE SHIELD | Admitting: Family

## 2021-12-08 DIAGNOSIS — N281 Cyst of kidney, acquired: Secondary | ICD-10-CM

## 2021-12-08 DIAGNOSIS — Z85528 Personal history of other malignant neoplasm of kidney: Secondary | ICD-10-CM | POA: Insufficient documentation

## 2021-12-08 DIAGNOSIS — Z905 Acquired absence of kidney: Secondary | ICD-10-CM

## 2021-12-08 MED ORDER — GADOTERIDOL 279.3 MG/ML (PROHANCE) IV SOLN *I*
20.0000 mL | Freq: Once | INTRAVENOUS | Status: AC
Start: 2021-12-08 — End: 2021-12-08
  Administered 2021-12-08: 20 mL via INTRAVENOUS

## 2021-12-11 NOTE — Progress Notes (Signed)
[facility]        Cardiology Office Consult Note    Date of Consult: 12/15/2021 Patient: Harry Fleming   Patients PCP: Georgia Lopes, MD Patient DOB: [birth year]  EMRN: [id number]     History of Present Illness/Reason For Visit     I had the pleasure of seeing Harry Fleming in cardiology consult on 12/15/2021. Domique is an 61 y.o. male who we were asked to see to establish care.  Mr. Packwood has a history of HTN, CVA/TIA, PFO s/p Amplatzer occluder in 2017, HLD, trigeminy, renal cell CA s/p partial nephrectomy, and leiomyosarcoma of the shoulder.    He has moved here from [place] to be closer to his son and grandchildren.  He is establishing care with [place] given his history of the PFO with the Amplatzer device.  Overall he feels quite good and is not experiencing any significant palpitations, recurrent TIAs or CVA days, exertional chest pain or shortness of breath and is able to do significant activity, no orthopnea, PND, leg edema, dizziness or syncope.        Past Medical and Surgical History     Past Medical History:   Diagnosis Date    CVA (cerebral vascular accident)     H/O Amplatzer atrial septal defect closure 09/2015    For PFO    History of renal cell carcinoma     HLD (hyperlipidemia)     Hypertension     Leiomyosarcoma     Shoulder    PFO (patent foramen ovale)     Renal lesion     TIA (transient ischemic attack) 2017    Trigeminy 09/08/2021     Past Surgical History:   Procedure Laterality Date    PARTIAL NEPHRECTOMY         Medications and Allergies     Current Outpatient Medications   Medication Sig    atorvastatin (LIPITOR) 40 mg tablet Take 1 tablet (40 mg total) by mouth daily    valsartan (DIOVAN) 160 MG tablet Take 1 tablet (160 mg total) by mouth daily    aspirin 81 mg EC tablet Take 1 tablet (81 mg total) by mouth daily     He is allergic to amoxicillin and cat dander.    Social and Family History     Family History   Problem Relation Age of Onset    High cholesterol Father      Hypertension Father     Cancer Father         Renal cell carcinoma    Kidney cancer Father 86        died from mets    Cancer Paternal Grandmother 41 - 40        gallbladder- died at age 61    High cholesterol Paternal Grandfather     Hypertension Paternal Grandfather     Pancreatic Cancer Paternal Grandfather 48    Pancreatic Cancer Maternal Aunt 41 - 25     Social History     Socioeconomic History    Marital status: Married   Tobacco Use    Smoking status: Never    Smokeless tobacco: Never   Substance and Sexual Activity    Alcohol use: Yes     Alcohol/week: 5.0 standard drinks of alcohol     Types: 5 Shots of liquor per week     Comment: 5 glasses of bourbon a week.    Drug use: Never    Sexual activity: Yes  Partners: Female     Birth control/protection: None         Review of Systems     Review of Systems   Constitutional:  Negative for malaise/fatigue.   HENT: Negative.     Eyes: Negative.    Respiratory:  Negative for shortness of breath.    Cardiovascular:  Negative for chest pain, palpitations, orthopnea, leg swelling and PND.   Gastrointestinal: Negative.    Genitourinary: Negative.    Musculoskeletal: Negative.    Neurological: Negative.    Endo/Heme/Allergies: Negative.    Psychiatric/Behavioral: Negative.       Vitals and Physical Exam     Nickalus's  height is 1.842 m (6' 0.5") and weight is 102.5 kg (226 lb). His blood pressure is 148/100 (abnormal) and his pulse is 70. His oxygen saturation is 98%.  Body mass index is 30.23 kg/m.    Physical Exam  Vitals reviewed.   Constitutional:       Appearance: Normal appearance.      Comments: Repeat blood pressure 138/78   HENT:      Head: Normocephalic and atraumatic.   Neck:      Vascular: No carotid bruit.   Cardiovascular:      Rate and Rhythm: Normal rate and regular rhythm.      Pulses: Normal pulses.      Heart sounds: Normal heart sounds. No murmur heard.     No friction rub. No gallop.   Pulmonary:      Effort: Pulmonary effort is normal.      Breath  sounds: Normal breath sounds. No wheezing, rhonchi or rales.   Musculoskeletal:         General: No swelling.      Cervical back: Neck supple.      Right lower leg: No edema.      Left lower leg: No edema.   Skin:     General: Skin is warm.   Neurological:      Mental Status: He is alert.       Laboratory Data     Hematology:   Results in Past 730 Days  Result Component Current Result Previous Result   WBC 4.3 (10/07/2021) Not in Time Range   Hemoglobin 14.7 (10/07/2021) Not in Time Range   Hematocrit 44 (10/07/2021) Not in Time Range   Platelets 280 (10/07/2021) Not in Time Range     Chemistry:   Results in Past 730 Days  Result Component Current Result Previous Result   Sodium 139 (10/07/2021) Not in Time Range   Potassium 4.8 (10/07/2021) Not in Time Range   Creatinine 1.06 (10/07/2021) Not in Time Range   Glucose 90 (10/07/2021) Not in Time Range   Calcium 9.5 (10/07/2021) Not in Time Range   Hemoglobin A1C 6.1 (H) (10/07/2021) Not in Time Range   AST 32 (10/07/2021) Not in Time Range   ALT 44 (10/07/2021) Not in Time Range   TSH 1.14 (10/07/2021) Not in Time Range     Coagulation Studies:   No results found for requested labs within last 730 days.     Cardiac:   No results found for requested labs within last 730 days.     Lipids:   Results in Past 730 Days  Result Component Current Result Previous Result   Cholesterol 184 (10/07/2021) Not in Time Range   HDL 45 (10/07/2021) Not in Time Range   Triglycerides 124 (10/07/2021) Not in Time Range   LDL Calculated 114 (10/07/2021) Not in  Time Range   Chol/HDL Ratio 4.1 (10/07/2021) Not in Time Range       Cardiac/Imaging Data & Risk Scores     ECG: Sinus rhythm with a rate of 64 with some diffuse T wave abnormalities           Impression and Plan     Patient Active Problem List   Diagnosis Code    History of renal cell carcinoma Z85.528    Hypertension I10    Renal lesion N28.9    Trigeminy R00.8    H/O partial nephrectomy Z90.5    HLD (hyperlipidemia) E78.5    H/O Amplatzer atrial  septal defect closure Z87.74    TIA (transient ischemic attack) G45.9    CVA (cerebral vascular accident) I63.9       This is an 61 y.o. male with a history of HTN, CVA/TIA, PFO s/p Amplatzer occluder in 2017, HLD, trigeminy, renal cell CA s/p partial nephrectomy, and leiomyosarcoma of the shoulder who is here to establish care.    1.  History of TIA/CVA in the setting of a PFO   -2017 he had a Amplatzer septal occluder placed and has had yearly echo since without any issue.   -He remains on aspirin 81 mg daily   -We will get an echocardiogram so we have a baseline now that he is establishing care with [place] and make sure that there is no signs of any erosion with Amplatzer device.    2.  Hypertension   -Blood pressure was a little bit elevated on arrival today but did improve throughout the course of the visit   -Continue valsartan 160 mg daily    -Low-sodium diet 2000 2500 mg daily    3.  Hyperlipidemia   -Continue atorvastatin 40 mg daily    Patient has never had a history of coronary disease.  I have answered all of his questions to the best my abilities we will get the echo as above to establish care.  He will follow-up with Dr. Waylan Boga as his new cardiologist in this area.  I will keep him posted regarding results of his echo with MyChart.          Pandora Leiter, PA-C   Electronically signed on 12/15/2021 at 2:41 PM.

## 2021-12-15 ENCOUNTER — Encounter: Payer: Self-pay | Admitting: Family

## 2021-12-15 ENCOUNTER — Ambulatory Visit: Payer: BLUE CROSS/BLUE SHIELD | Admitting: Cardiology

## 2021-12-15 ENCOUNTER — Encounter: Payer: Self-pay | Admitting: Cardiology

## 2021-12-15 ENCOUNTER — Other Ambulatory Visit
Admission: RE | Admit: 2021-12-15 | Discharge: 2021-12-15 | Disposition: A | Discharge status: Home / Self Care | Payer: BLUE CROSS/BLUE SHIELD | Source: Ambulatory Visit

## 2021-12-15 ENCOUNTER — Ambulatory Visit: Payer: BLUE CROSS/BLUE SHIELD | Admitting: Family

## 2021-12-15 ENCOUNTER — Encounter: Payer: Self-pay | Admitting: Gastroenterology

## 2021-12-15 ENCOUNTER — Other Ambulatory Visit: Payer: Self-pay

## 2021-12-15 ENCOUNTER — Other Ambulatory Visit
Admission: RE | Admit: 2021-12-15 | Discharge: 2021-12-15 | Disposition: A | Discharge status: Home / Self Care | Payer: BLUE CROSS/BLUE SHIELD | Source: Ambulatory Visit | Attending: Urology | Admitting: Urology

## 2021-12-15 ENCOUNTER — Other Ambulatory Visit: Admission: RE | Admit: 2021-12-15 | Payer: BLUE CROSS/BLUE SHIELD | Source: Ambulatory Visit

## 2021-12-15 VITALS — BP 148/100 | HR 70 | Ht 72.5 in | Wt 226.0 lb

## 2021-12-15 DIAGNOSIS — Z7183 Encounter for nonprocreative genetic counseling: Secondary | ICD-10-CM

## 2021-12-15 DIAGNOSIS — I10 Essential (primary) hypertension: Secondary | ICD-10-CM

## 2021-12-15 DIAGNOSIS — Z800 Family history of malignant neoplasm of digestive organs: Secondary | ICD-10-CM

## 2021-12-15 DIAGNOSIS — Z85528 Personal history of other malignant neoplasm of kidney: Secondary | ICD-10-CM | POA: Insufficient documentation

## 2021-12-15 DIAGNOSIS — E785 Hyperlipidemia, unspecified: Secondary | ICD-10-CM

## 2021-12-15 DIAGNOSIS — N289 Disorder of kidney and ureter, unspecified: Secondary | ICD-10-CM

## 2021-12-15 DIAGNOSIS — Z8774 Personal history of (corrected) congenital malformations of heart and circulatory system: Secondary | ICD-10-CM

## 2021-12-15 DIAGNOSIS — Z1371 Encounter for nonprocreative screening for genetic disease carrier status: Secondary | ICD-10-CM

## 2021-12-15 DIAGNOSIS — Z905 Acquired absence of kidney: Secondary | ICD-10-CM | POA: Insufficient documentation

## 2021-12-15 DIAGNOSIS — Z809 Family history of malignant neoplasm, unspecified: Secondary | ICD-10-CM

## 2021-12-15 DIAGNOSIS — Z789 Other specified health status: Secondary | ICD-10-CM

## 2021-12-15 DIAGNOSIS — G459 Transient cerebral ischemic attack, unspecified: Secondary | ICD-10-CM | POA: Insufficient documentation

## 2021-12-15 DIAGNOSIS — R9431 Abnormal electrocardiogram [ECG] [EKG]: Secondary | ICD-10-CM

## 2021-12-15 DIAGNOSIS — I639 Cerebral infarction, unspecified: Secondary | ICD-10-CM | POA: Insufficient documentation

## 2021-12-15 LAB — BASIC METABOLIC PANEL
Anion Gap: 9 (ref 7–16)
CO2: 27 mmol/L (ref 20–28)
Calcium: 9.7 mg/dL (ref 8.6–10.2)
Chloride: 100 mmol/L (ref 96–108)
Creatinine: 0.94 mg/dL (ref 0.67–1.17)
Glucose: 115 mg/dL — ABNORMAL HIGH (ref 60–99)
Lab: 15 mg/dL (ref 6–20)
Potassium: 4.4 mmol/L (ref 3.3–5.1)
Sodium: 136 mmol/L (ref 133–145)
eGFR BY CREAT: 93 *

## 2021-12-15 LAB — EKG 12-LEAD
P: 51 deg
PR: 193 ms
QRS: 31 deg
QRSD: 83 ms
QT: 380 ms
QTc: 393 ms
Rate: 64 {beats}/min
T: 35 deg

## 2021-12-15 NOTE — Progress Notes (Signed)
Mode of Communication with Patient for This Visit            Telephone Visit     Reason for visit: Hereditary Cancer Risk Assessment     Patient Location: Home    Provider Location: Clinic     Other Providers on Visit: N/A    I verified the patient is in a quiet, private place to talk openly for this medical appointment.     PATIENT INFORMATION/HPI: Harry Fleming 61 y.o. is a male who was seen for an appointment today with the [facility] Hereditary Cancer Screening and Risk Reduction Program for hereditary cancer risk assessment.    The patient was counseled and evaluated by me for genetic testing on 12/15/21, he is interested in genetic testing due to personal and family history of cancer.    Patient's problem list, allergies, and medications were not reviewed and updated as appropriate (N/A, counseling only). Please see the EHR for full details.    REFERRING: Demetrios Loll, NP     Care Team                Georgia Lopes, MD PCP - General, Family Medicine [phone]            PERSONAL HISTORY OF CANCER:  The patient has a personal history of leiomyosarcoma at age 33 (back right shoulder) and renal cell carcinoma at age 50.  He also has bilateral renal cysts.   The patient has had a colonoscopy. He denies polyps.     FAMILY HISTORY OF CANCER:   A three generation cancer family history was obtained at this visit. No pathology reports or records were available for review to confirm diagnoses of relatives.    - On maternal side, patient is of European/White  descent, no Ashkenazi Jewish ancestry .   Relevant maternal family history includes:   Maternal aunt deceased from pancreatic cancer in her 27's.     - On paternal side, patient is of European/White  descent, no Ashkenazi Jewish ancestry .   Relevant paternal family history includes:  Father died from metastatic renal cell carcinoma at age 92. Paternal grandmother with gallbladder cancer in her early 35's, deceased at age 61. Paternal grandfather died  from metastatic pancreatic cancer around age 59.     Harry Fleming has four brothers, no cancers.   Harry Fleming has one son and one daughter, no cancers.      ASSESSMENT:    Demitri Mesecher 61 y.o. male presents to the [facility] Hereditary Cancer Screening and Risk Reduction Program for hereditary cancer risk assessment due to personal and family history of cancer.     Hereditary cancer risk assessment and genetic counseling was provided at today's appointment that discussed the following topics:    - An overview of cancer genetics, inheritance patterns, and current risk for hereditary cancer syndromes. 5-10% of cancers are explained by pathogenic germline variants. There are red flags in the patient's personal and/or family history of cancer which may indicated a hereditary cancer syndrome.      Panel Testing: There is 5-10% chance this patient inherited a cancer predisposition gene based on information provided. Per NCCN, panel testing is recommended, as potential actionable information can be missed with limited testing. All genes on panel test are actionable with clear guidelines on medical management. I shared with the patient that possible results include:     Positive Results- For positive results (pathogenic variant identified on testing): If the patient were  found to have a pathogenic variant in a cancer predisposition gene, then they would be considered at increased risk to develop cancer over the general population.  The identification of such a variant in this patient could alter medical management. This may include increasing the frequency or type of cancer surveillance recommended to patient as well as consideration of prophylactic surgeries or chemoprevention to decrease cancer risk. For those with a diagnosis of cancer, this may or may not also provide information related to surgery and treatment for current cancer.     Negative Results:  A negative result could mean that: (1) there is an  altered gene in the family that the patient did not inherit,  (2) the cancer in the family is not due to a currently identified inherited susceptibility, or (3) a different gene or genes that were not analyzed may be responsible for the cancer or (4) there is a mutation in the analyzed gene, but the laboratory could not identify it with the current technology.  Even if the result is negative, our office will discuss a cancer screening plan based on the patient's personal and family history of cancer and other risk factors contributing to overall cancer risk.      Variant of Uncertain Significance on Results: If the result is classified as "uncertain" this means the patient has an identified variation in one of the cancer genes, but the meaning and pathogencity of this DNA change is unknown at this time, with no data suggesting pathogenicity at this time.  Cancer screenings will be based solely on the patient's family history of cancer only.  The laboratory highlights variants that are present but do not require clinical action. Should the laboratory determine that this variant becomes clinically actionable, an amended report will be generated.      - Our discussion today also covered the following downsides to germline hereditary cancer testing, including:   Financial Concerns: Insurance: We discussed financial responsibility for genetic testing as well as insurance coverage for genetic testing. The patient may have a copay for their visit in our clinic, as well as for genetic testing. I explained it is based on their medical insurance and plan copays, deductibles, and coinsurance. Based on the personal and family history provided by the patient and from the medical record, the patient meets criteria for genetic testing. The patient was counseled that they are responsible for responding to inquiries from the genetic testing company regarding insurance/out of pocket costs.  Emotional Burden: The patient and I  discussed that the results from genetic testing may trigger feelings of anxiety, fear of cancer, excess worry about health status, guilt about passing on the gene to children, or knowledge that others in the family may react or be impacted negatively by this information.   Discrimination/Privacy: I informed the patient about [employer]. This is a [employer] that protects individuals from being discriminated against related to employment and health insurance. I notified the patient that pathogenic variants on germline testing cannot be used by these entities to increase premiums or be used as a preexisting health condition. I notified the patient that [employer] does not provide written protections for life insurance, disability, and long-term care insurance. I also shared that this information is private.     - We discussed how the sample is obtained, I provided teaching on specimen collection as appropriate. He prefers blood draw, is coming to [facility] today for sample collection.     Based on the personal/family history provided today,  the patient meets criteria for testing.   The patient provided consent to proceed with testing today. The following test was ordered: [facility] Multi Cancers Panel    PLAN:    Hereditary Cancer Risk Assessment:   Genetic testing ordered (see above). If results are negative, the patient will be counseled on recommended screenings for them/their family. If positive, we will discuss recommendations with the patient on testing of relatives and screening plan/risk reduction for known pathogenic variant. We discussed results disclosure. Patient opts to have results disclosed via: Telephone: The patient would like a phone call with genetic testing results. Discussed these take about 4 weeks to return, they are comfortable with plan.     The patient was encouraged to call the hereditary clinic office at any time if problems or questions arise. The patient was counseled that questions may arise throughout  the process, and were encouraged to call or utilize MyChart if applicable. He had the opportunity to ask questions.  All his questions were answered to his satisfaction. He is agreeable with this plan.    The plan was discussed with the patient and the patient/patient rep demonstrated understanding to the provider's satisfaction.    Consent was previously obtained from the patient to complete this Telephone consult; including the potential for financial liability.    21+ minutes was spent on the calendar day of the encounter reviewing the EMR and management of this patient, this time includes both pre and post visit work.    Britten Parady L. Damyah Gugel, DNP, FNP-C, ACGN  Hereditary Cancer Screening and Risk Reduction Program  [facility]

## 2021-12-29 ENCOUNTER — Telehealth: Payer: Self-pay | Admitting: Family

## 2021-12-29 ENCOUNTER — Encounter: Payer: Self-pay | Admitting: Family

## 2021-12-29 NOTE — Telephone Encounter (Signed)
Left message with Harry Fleming to notify him that his genetic test results are available. I let Harry Fleming know he can call me back at the Hereditary Program at their earliest convenience to go over these results. Wayland Denis, NP

## 2021-12-30 ENCOUNTER — Encounter: Payer: Self-pay | Admitting: Family

## 2021-12-30 ENCOUNTER — Telehealth: Payer: Self-pay | Admitting: Family

## 2021-12-30 NOTE — Telephone Encounter (Signed)
Left message with Claire Kulaga to notify him that his genetic test results are available. I let Primus Weier know he can call me back at the Hereditary Program at their earliest convenience to go over these results. Lavonia Eager, NP

## 2021-12-31 ENCOUNTER — Telehealth: Payer: Self-pay | Admitting: Family

## 2021-12-31 NOTE — Telephone Encounter (Signed)
[facility] Hereditary Cancer Screening and Risk Reduction Clinic  Patient ID: Harry Fleming 61 y.o.  Referred by Fortunato Curling for genetic cancer risk assessment.     RESULT:    [facility] Multi Cancers Panel:   Negative for any clinically significant alterations or pathogenic germline variants.   No detectable inherited cancer syndrome.     I recommend his family maintain an increased awareness for cancer, and inform all doctors about the family history of cancer and increased risk for cancer in the family.  Discussed that given his personal and family history, his siblings/children should be offered abdominal ultrasound every 1-2 years starting in their 48's.      Continue cancer screening per general population guidelines.   No additional genetic testing indicated for his family.   Patient's children do not need genetic testing.   Continue follow up with oncology team for management of cancer.       We will mail the report to his home address. The patient will be sent copies of this report and a copy will be scanned into their chart.     Wayland Denis, NP

## 2022-01-01 ENCOUNTER — Telehealth: Payer: Self-pay | Admitting: Urology

## 2022-01-01 ENCOUNTER — Other Ambulatory Visit: Payer: Self-pay

## 2022-01-01 ENCOUNTER — Other Ambulatory Visit: Payer: Self-pay | Admitting: Urology

## 2022-01-01 ENCOUNTER — Encounter: Payer: Self-pay | Admitting: Urology

## 2022-01-01 ENCOUNTER — Ambulatory Visit: Payer: BLUE CROSS/BLUE SHIELD | Admitting: Urology

## 2022-01-01 VITALS — Ht 72.0 in | Wt 226.0 lb

## 2022-01-01 DIAGNOSIS — R3989 Other symptoms and signs involving the genitourinary system: Secondary | ICD-10-CM

## 2022-01-01 DIAGNOSIS — C649 Malignant neoplasm of unspecified kidney, except renal pelvis: Secondary | ICD-10-CM

## 2022-01-01 LAB — POCT URINALYSIS DIPSTICK
Blood,UA POCT: NEGATIVE
Exp date: 20240331
Glucose,UA POCT: NORMAL mg/dL
Ketones,UA POCT: NEGATIVE mg/dL
Leuk Esterase,UA POCT: NEGATIVE
Lot #: 65249402
Nitrite,UA POCT: NEGATIVE
PH,UA POCT: 5 (ref 5–8)
Protein,UA POCT: NEGATIVE mg/dL
Specific gravity,UA POCT: 1.025 (ref 1.002–1.030)

## 2022-01-01 NOTE — Progress Notes (Signed)
Pt here with wife to discuss recent MRI results.  Pt has hx of RCC, was seen by PCP for updated imaging.  Had moved out of the area where surgery was.  Has not seen any blood in his urine.  Denies urinary problems.  Has had no pain.

## 2022-01-01 NOTE — Telephone Encounter (Signed)
Called [facility]  @ [phone] requested patient medical records  , Pathology / Imaging and chart notes to be faxed  to [place] location   Faxed stat request to [phone]

## 2022-01-01 NOTE — Progress Notes (Signed)
Chief complaint: h/o RCC    History of present illness:  HPI  Pt here with wife to discuss recent MRI results.  Pt has hx of RCC s/p left partial nephrectomy.  Recent genetic testing was negative.  Has not seen any blood in his urine.  Denies urinary problems.  Has had no pain.       ROS-Urology    Questionnaire Scores:  AUA Symptom Total: 2  Urology Questionnaire Scores          01/01/2022 11/24/2021   Urology Questionnaire Scores   AUA Symptom Total 2 1   If you were to spend the rest of your life with your urinary condition just the way it is now, how would you feel about that? 5 5           Medications:   Current Outpatient Medications   Medication    atorvastatin (LIPITOR) 40 mg tablet    valsartan (DIOVAN) 160 MG tablet    aspirin 81 mg EC tablet     No current facility-administered medications for this visit.       Allergies:   Allergies   Allergen Reactions    Amoxicillin Nausea And Vomiting    Cat Dander Other (See Comments)     Eyes swell.        Past medical history:   Past Medical History:   Diagnosis Date    CVA (cerebral vascular accident)     H/O Amplatzer atrial septal defect closure 09/2015    For PFO    History of renal cell carcinoma     HLD (hyperlipidemia)     Hypertension     Leiomyosarcoma     Shoulder    PFO (patent foramen ovale)     Renal lesion     TIA (transient ischemic attack) 2017    Trigeminy 09/08/2021       Past surgical history:   Past Surgical History:   Procedure Laterality Date    PARTIAL NEPHRECTOMY         Family history: Urological-related family history includes Kidney cancer (age of onset: 62) in his father.    Social History:   Social History     Socioeconomic History    Marital status: Married   Tobacco Use    Smoking status: Never    Smokeless tobacco: Never   Substance and Sexual Activity    Alcohol use: Yes     Alcohol/week: 5.0 standard drinks of alcohol     Types: 5 Shots of liquor per week     Comment: 5 glasses of bourbon a week.    Drug use: Never    Sexual  activity: Yes     Partners: Female     Birth control/protection: None         Vital signs: Height 1.829 m (6'), weight 102.5 kg (226 lb).    Recent Results (from the past 24 hour(s))   POCT urinalysis dipstick    Collection Time: 01/01/22 11:35 AM   Result Value Ref Range    Specific gravity,UA POCT 1.025 1.002 - 1.030    PH,UA POCT 5.0 5 - 8    Leuk Esterase,UA POCT Negative Negative    Nitrite,UA POCT Negative Negative    Protein,UA POCT Negative Negative mg/dL    Glucose,UA POCT Normal Normal mg/dL    Ketones,UA POCT Negative Negative mg/dL    Urobilinogen,UA Test Not Performed Less than 1 mg/dL    Bilirubin,Ur Test Not Performed Negative, Test Not Performed  Blood,UA POCT Negative Negative    Exp date [id number]     Lot # [id number]      MRI abdomen 12/18/21:   No prior imaging available for comparison.      Partial nephrectomy changes appears to be in the LEFT kidney rather than the RIGHT. Recommend correlation with outside medical record.      Additional Bosniak 1/2 renal cysts bilaterally as detailed above.      END OF IMPRESSION       Assessment: History of left partial nephrectomy with no evidence of disease recurrence on recent imaging.  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  Will try and obtain outside records to see what subtype of RCC he had.      Plan: 1 year with abdominal MRI.

## 2022-01-04 ENCOUNTER — Other Ambulatory Visit: Payer: Self-pay | Admitting: Family Medicine

## 2022-01-07 ENCOUNTER — Encounter: Payer: Self-pay | Admitting: Urology

## 2022-01-07 NOTE — Progress Notes (Signed)
Imaging requested a second time.  Faxed request for CT, [place], and MRI's to be pushed to power share.  [id number]

## 2022-01-16 ENCOUNTER — Other Ambulatory Visit: Payer: Self-pay

## 2022-01-18 ENCOUNTER — Other Ambulatory Visit: Payer: Self-pay

## 2022-01-22 ENCOUNTER — Other Ambulatory Visit: Payer: BLUE CROSS/BLUE SHIELD

## 2022-01-30 ENCOUNTER — Ambulatory Visit: Payer: BLUE CROSS/BLUE SHIELD | Admitting: Orthopedic Surgery

## 2022-05-08 ENCOUNTER — Other Ambulatory Visit: Payer: Self-pay | Admitting: Family Medicine

## 2022-05-08 MED ORDER — VALSARTAN 160 MG PO TABS *I*
160.0000 mg | ORAL_TABLET | Freq: Every day | ORAL | 1 refills | Status: DC
Start: 2022-05-08 — End: 2022-08-07

## 2022-05-08 NOTE — Telephone Encounter (Signed)
Last appointment: 09/08/2021  Next appointment: 07/13/2022              Lab results: 10/07/21  1121   WBC 4.3   Hemoglobin 14.7   Hematocrit 44   RBC 4.9   Platelets 280        Lab Results   Component Value Date    HA1C 6.1 (H) 10/07/2021         Lab results: 10/07/21  1121   TSH 1.14           Lab results: 12/15/21  1345 10/07/21  1121   Sodium 136 139   Potassium 4.4 4.8   Chloride 100 101   CO2 27 26   UN 15 15   Creatinine 0.94 1.06   Glucose 115* 90   Calcium 9.7 9.5   Total Protein  --  7.3   Albumin  --  4.5   ALT  --  44   AST  --  32   Alk Phos  --  98   Bilirubin,Total  --  0.5       @LIPID @

## 2022-06-02 ENCOUNTER — Other Ambulatory Visit: Payer: Self-pay | Admitting: Family Medicine

## 2022-06-02 MED ORDER — ATORVASTATIN CALCIUM 40 MG PO TABS *I*
40.0000 mg | ORAL_TABLET | Freq: Every day | ORAL | 1 refills | Status: DC
Start: 2022-06-02 — End: 2022-12-01

## 2022-06-02 NOTE — Telephone Encounter (Signed)
Last appointment: 09/08/2021  Next appointment: 07/13/2022              Lab results: 10/07/21  1121   WBC 4.3   Hemoglobin 14.7   Hematocrit 44   RBC 4.9   Platelets 280        Lab Results   Component Value Date    HA1C 6.1 (H) 10/07/2021         Lab results: 10/07/21  1121   TSH 1.14           Lab results: 12/15/21  1345 10/07/21  1121   Sodium 136 139   Potassium 4.4 4.8   Chloride 100 101   CO2 27 26   UN 15 15   Creatinine 0.94 1.06   Glucose 115* 90   Calcium 9.7 9.5   Total Protein  --  7.3   Albumin  --  4.5   ALT  --  44   AST  --  32   Alk Phos  --  98   Bilirubin,Total  --  0.5       @LIPID@

## 2022-07-13 ENCOUNTER — Other Ambulatory Visit: Payer: Self-pay

## 2022-07-13 ENCOUNTER — Ambulatory Visit: Payer: BLUE CROSS/BLUE SHIELD | Admitting: Family Medicine

## 2022-07-13 ENCOUNTER — Other Ambulatory Visit
Admission: RE | Admit: 2022-07-13 | Discharge: 2022-07-13 | Disposition: A | Discharge status: Home / Self Care | Payer: BLUE CROSS/BLUE SHIELD | Source: Ambulatory Visit | Attending: Family Medicine | Admitting: Family Medicine

## 2022-07-13 ENCOUNTER — Encounter: Payer: Self-pay | Admitting: Family Medicine

## 2022-07-13 VITALS — BP 126/78 | Ht 72.0 in | Wt 235.0 lb

## 2022-07-13 DIAGNOSIS — C499 Malignant neoplasm of connective and soft tissue, unspecified: Secondary | ICD-10-CM

## 2022-07-13 DIAGNOSIS — I10 Essential (primary) hypertension: Secondary | ICD-10-CM | POA: Insufficient documentation

## 2022-07-13 DIAGNOSIS — Z125 Encounter for screening for malignant neoplasm of prostate: Secondary | ICD-10-CM | POA: Insufficient documentation

## 2022-07-13 DIAGNOSIS — E785 Hyperlipidemia, unspecified: Secondary | ICD-10-CM | POA: Insufficient documentation

## 2022-07-13 DIAGNOSIS — Z0000 Encounter for general adult medical examination without abnormal findings: Secondary | ICD-10-CM

## 2022-07-13 LAB — CBC
Hematocrit: 44 % (ref 37–52)
Hemoglobin: 14.5 g/dL (ref 12.0–17.0)
MCV: 92 fL (ref 75–100)
Platelets: 251 10*3/uL (ref 150–450)
RBC: 4.8 MIL/uL (ref 4.0–6.0)
RDW: 13.3 % (ref 0.0–15.0)
WBC: 5 10*3/uL (ref 3.5–11.0)

## 2022-07-13 LAB — POCT URINALYSIS DIPSTICK
Blood,UA POCT: NEGATIVE
Glucose,UA POCT: NORMAL mg/dL
Ketones,UA POCT: NEGATIVE mg/dL
Leuk Esterase,UA POCT: NEGATIVE
Lot #: 70153302
Nitrite,UA POCT: NEGATIVE
PH,UA POCT: 5 (ref 5–8)
Protein,UA POCT: NEGATIVE mg/dL
Specific gravity,UA POCT: 1.02 (ref 1.002–1.030)

## 2022-07-13 LAB — TSH: TSH: 1.52 u[IU]/mL (ref 0.27–4.20)

## 2022-07-13 LAB — VITAMIN D: 25-OH Vit Total: 31 ng/mL (ref 30–60)

## 2022-07-13 LAB — LIPID PANEL
Chol/HDL Ratio: 4.1
Cholesterol: 194 mg/dL
HDL: 47 mg/dL (ref 40–60)
LDL Calculated: 126 mg/dL
Non HDL Cholesterol: 147 mg/dL
Triglycerides: 105 mg/dL

## 2022-07-13 LAB — COMPREHENSIVE METABOLIC PANEL
ALT: 38 U/L (ref 0–50)
AST: 29 U/L (ref 0–50)
Albumin: 4.5 g/dL (ref 3.5–5.2)
Alk Phos: 86 U/L (ref 40–130)
Anion Gap: 11 (ref 7–16)
Bilirubin,Total: 0.7 mg/dL (ref 0.0–1.2)
CO2: 24 mmol/L (ref 20–28)
Calcium: 9.3 mg/dL (ref 8.6–10.2)
Chloride: 101 mmol/L (ref 96–108)
Creatinine: 1.09 mg/dL (ref 0.67–1.17)
Glucose: 98 mg/dL (ref 60–99)
Lab: 16 mg/dL (ref 6–20)
Potassium: 4.4 mmol/L (ref 3.3–5.1)
Sodium: 136 mmol/L (ref 133–145)
Total Protein: 7.4 g/dL (ref 6.3–7.7)
eGFR BY CREAT: 78 *

## 2022-07-13 LAB — VITAMIN B12: Vitamin B12: 296 pg/mL (ref 232–1245)

## 2022-07-13 LAB — PSA (EFF.4-2010): PSA (eff. 4-2010): 1.05 ng/mL (ref 0.00–4.00)

## 2022-07-13 NOTE — Progress Notes (Signed)
Adult Physical    Harry Fleming is a 62 y.o.male  here for an annual exam today.  Patient also has the following concerns that were addressed today:    Chief Complaint   Patient presents with    Annual Exam        1.  Hypertension/dyslipidemia   Patient has a history of PFO, hypertension, TIA, and trigeminy.  Patient is exercising and tries to be adherent to a low-salt diet.  Patient denies: chest pain, chest pressure/discomfort, claudication, dyspnea, exertional chest pressure/discomfort, lower extremity edema, near-syncope, palpitations, syncope and tachypnea. Cardiovascular risk factors: advanced age (older than 48 for men, 84 for women), hypertension and male gender. Use of agents associated with hypertension: none.  Patient is currently on valsartan 160 mg daily and atorvastatin 40 mg daily.  Patient does follow with cardiology.    2. 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  Patient did not require any chemotherapy or radiation.  Patient was following with oncology for 2 years.  Thereafter he was following up with dermatology every year for full body skin checks.    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/2021    TIA (transient ischemic attack) 12/15/2021    CVA (cerebral vascular accident) 12/15/2021    HLD (hyperlipidemia) 10/21/2021    History of renal cell carcinoma 09/08/2021    Hypertension 09/08/2021    Renal lesion 09/08/2021    Trigeminy 09/08/2021    H/O partial nephrectomy 09/08/2021      Past Medical History:   Diagnosis Date    CVA (cerebral vascular accident)     H/O Amplatzer atrial septal defect closure 09/2015    For PFO    History of renal cell carcinoma     HLD (hyperlipidemia)     Hypertension     Leiomyosarcoma      Shoulder    PFO (patent foramen ovale)     Renal lesion     TIA (transient ischemic attack) 2017    Trigeminy 09/08/2021      Past Surgical History:   Procedure Laterality Date    PARTIAL NEPHRECTOMY       Current Outpatient Medications   Medication    atorvastatin (LIPITOR) 40 mg tablet    valsartan (DIOVAN) 160 MG tablet    aspirin 81 mg EC tablet     No current facility-administered medications for this visit.     Allergies   Allergen Reactions    Amoxicillin Nausea And Vomiting    Cat Dander Other (See Comments)     Eyes swell.      Family History   Problem Relation Age of Onset    High cholesterol Father     Hypertension Father     Cancer Father         Renal cell carcinoma    Kidney cancer Father 64        died from mets    Cancer Paternal Grandmother 65 - 45        gallbladder- died at age 40    High cholesterol Paternal Grandfather     Hypertension Paternal Grandfather     Pancreatic Cancer Paternal Grandfather 105    Pancreatic Cancer Maternal Aunt 60 - 69  Social History     Socioeconomic History    Marital status: Married   Tobacco Use    Smoking status: Never    Smokeless tobacco: Never   Substance and Sexual Activity    Alcohol use: Yes     Alcohol/week: 5.0 standard drinks of alcohol     Types: 5 Shots of liquor per week     Comment: 5 glasses of bourbon a week.    Drug use: Never    Sexual activity: Yes     Partners: Female     Birth control/protection: None          Health Maintenance  Updated today, see HMP section in eRecord.    ROS    Constitutional:  --negative for fever and chills   --negative for unintentional weight loss  Eyes:  -- negative for blurred vision  --negative for redness/irritation  ENMT:   --negative for congestion  --negative for sore throat or mouth lesions  Respiratory:   --negative for shortness of breath  --negative for hemoptysis  Cardiovascular:  --negative for chest pain  --negative for orthopnea  Gastrointestinal:  --negative for abdominal pain  --negative for melena or  hematochezia  Genitourinary:  --negative for pain with urination  --negative for hematuria  Heme/Lymph:  --negative for swollen lymph nodes  --negative for easy bruising  Skin/Breast:  --negative for rashes  Musculoskeletal:  --no joint deformity  --no joint swelling or redness  Neurologic:  --no focal muscle weakness  --no problems with speech or swallowing  Allergy/Imm:  --no hives  --no excessively frequent illnesses       PHYSICAL EXAM  BP 126/78   Pulse (P) 85   Ht 1.829 m (6')   Wt 106.6 kg (235 lb)   BMI 31.87 kg/m   Body mass index is 31.87 kg/m.     Constitutional: Well-developed, cooperative, no acute distress.  Answers questions appropriately.  Head: Normocephalic and atraumatic.   Right Ear: Tympanic membrane normal. Good light reflex, no bulging or retraction.  Left Ear: Tympanic membrane normal. Good light reflex, no bulging or retraction.  Mouth/Throat: Mucous membranes are moist. Oropharynx is clear.  Good dentition.  Eyes: Conjunctivae and EOM are normal.   Neck: Normal range of motion. No cervical adenopathy.    Cardiovascular: Regular rate and rhythm. No murmur, rub or gallop appreciated.   Pulmonary/Chest: Effort normal and breath sounds normal.  Lungs are clear to auscultation bilaterally without rales, wheezing, or rhonchi.  Abdomen: soft, non-tender, non-distended, no masses, no hepatosplenomegaly appreciated; no rebound or guarding; positive bowel sounds all 4 quadrants   Musculoskeletal: Normal range of motion and non antalgic gait.  Neurological: Alert with normal strength and normal reflexes. Coordination and gait normal.   Skin: Skin is warm. No cyanosis.   Extremities: No peripheral edema.      "Lynann Beaver, MD,certify that the patient has authorized the use of photography for the purpose of the provision of health care at the [facility] and affiliates and they understand that it will be included in the legal medical record."    All labs from the past  90 days   Office Visit on 07/13/2022   Component Date Value    Specific gravity,UA POCT 07/13/2022 1.020     PH,UA POCT 07/13/2022 5.0     Leuk Esterase,UA POCT 07/13/2022 Negative     Nitrite,UA POCT 07/13/2022 Negative     Protein,UA POCT 07/13/2022 Negative     Glucose,UA POCT 07/13/2022 Normal  Ketones,UA POCT 07/13/2022 Negative     Blood,UA POCT 07/13/2022 Negative     Exp date 07/13/2022 10/26/22     Lot # 07/13/2022 [id number]        ASSESSMENT and PLAN    Harry Fleming is a 62 y.o. male here for annual physical exam.  The following medical problems were also reviewed and addressed today:    Hypertension/dyslipidemia  Continue valsartan 160 mg daily and Lipitor 40 mg daily  Continue appropriate follow-up with cardiology.  Lifestyle modifications: weight reduction, discussed DASH eating plan, discussed dietary sodium reduction, discussed aerobic physical activity, discussed moderation of alcohol consumption  Check blood pressures daily and record.     History of leiomyosarcoma  E consult sent to dermatology to appreciate further recommendations.    Patient states he is up-to-date with colonoscopy screening.    PSA screening ordered.    Weight management: Body mass index is 31.87 kg/m.  Discussed the patient's BMI with him. The BMI is above average; BMI management plan is completed.      See orders for today for labs and referrals.  Orders Placed This Encounter   Procedures    CBC    Comprehensive metabolic panel    Hemoglobin A1c    Lipid Panel (Reflex to Direct  LDL if Triglycerides more than 400)    Vitamin D    TSH    Vitamin B12    PSA (eff.[id number])    POCT Urinalysis Dipstick        The following patient instructions were reviewed verbally with the patient, printed on their after visit summary and given to them at the end of today's visit.     Heart Healthy Diet   WHAT YOU NEED TO KNOW:   A heart healthy diet is an eating plan low in unhealthy fats and sodium (salt). The plan is high in healthy fats and  fiber. A heart healthy diet helps improve your cholesterol levels and lowers your risk for heart disease and stroke. A dietitian will teach you how to read and understand food labels.  DISCHARGE INSTRUCTIONS:   Heart healthy diet guidelines to follow:   Choose foods that contain healthy fats.      Unsaturated fats  include monounsaturated and polyunsaturated fats. Unsaturated fat is found in foods such as soybean, canola, olive, corn, and safflower oils. It is also found in soft tub margarine that is made with liquid vegetable oil.    Omega-3 fat  is found in certain fish, such as salmon, tuna, and trout, and in walnuts and flaxseed. Eat fish high in omega-3 fats at least 2 times a week.       Get 20 to 30 grams of fiber each day.  Fruits, vegetables, whole-grain foods, and legumes (cooked beans) are good sources of fiber.         Limit or do not have unhealthy fats.      Cholesterol  is found in animal foods, such as eggs and lobster, and in dairy products made from whole milk. Limit cholesterol to less than 200 mg each day.    Saturated fat  is found in meats, such as bacon and hamburger. It is also found in chicken or [place] skin, whole milk, and butter. Limit saturated fat to less than 7% of your total daily calories.    Trans fat  is found in packaged foods, such as potato chips and cookies. It is also in hard margarine, some fried foods, and shortening. Do not  eat foods that contain trans fats.    Limit sodium as directed.  You may be told to limit sodium to 2,000 to 2,300 mg each day. Choose low-sodium or no-salt-added foods. Add little or no salt to food you prepare. Use herbs and spices in place of salt.       Include the following in your heart healthy plan:  Ask your dietitian or healthcare provider how many servings to have from each of the following food groups:  Grains:      Whole-wheat breads, cereals, and pastas, and brown rice    Low-fat, low-sodium crackers and chips    Vegetables:      Broccoli,  green beans, green peas, and spinach    Collards, kale, and lima beans    Carrots, sweet potatoes, tomatoes, and peppers    Canned vegetables with no salt added    Fruits:      Bananas, peaches, pears, and pineapple    Grapes, raisins, and dates    Oranges, tangerines, grapefruit, orange juice, and grapefruit juice    Apricots, mangoes, melons, and papaya    Raspberries and strawberries    Canned fruit with no added sugar    Low-fat dairy:      Nonfat (skim) milk, 1% milk, and low-fat almond, cashew, or soy milks fortified with calcium    Low-fat cheese, regular or frozen yogurt, and cottage cheese    Meats and proteins:      Lean cuts of beef and pork (loin, leg, round), skinless chicken and [place]    Legumes, soy products, egg whites, or nuts    Limit or do not include the following in your heart healthy plan:   Unhealthy fats and oils:      Whole or 2% milk, cream cheese, sour cream, or cheese    High-fat cuts of beef (T-bone steaks, ribs), chicken or [place] with skin, and organ meats such as liver    Butter, stick margarine, shortening, and cooking oils such as coconut or palm oil    Foods and liquids high in sodium:      Packaged foods, such as frozen dinners, cookies, macaroni and cheese, and cereals with more than 300 mg of sodium per serving    Vegetables with added sodium, such as instant potatoes, vegetables with added sauces, or regular canned vegetables    Cured or smoked meats, such as hot dogs, bacon, and sausage    High-sodium ketchup, barbecue sauce, salad dressing, pickles, olives, soy sauce, or miso    Foods and liquids high in sugar:      Candy, cake, cookies, pies, or doughnuts    Soft drinks (soda), sports drinks, or sweetened tea    Canned or dry mixes for cakes, soups, sauces, or gravies    Other healthy heart guidelines:   Do not smoke.  Nicotine and other chemicals in cigarettes and cigars can cause lung and heart damage. Ask your healthcare provider for information if you currently smoke  and need help to quit. E-cigarettes or smokeless tobacco still contain nicotine. Talk to your healthcare provider before you use these products.     Limit or do not drink alcohol as directed.  Alcohol can damage your heart and raise your blood pressure. Your healthcare provider may give you specific daily and weekly limits. The general recommended limit is 1 drink a day for women 21 or older and for men 54 or older. Do not have more than 3 drinks in a day or  7 in a week. The recommended limit is 2 drinks a day for men 33 to 60 years of age. Do not have more than 4 drinks in a day or 14 in a week. A drink of alcohol is 12 ounces of beer, 5 ounces of wine, or 1 ounces of liquor.    Exercise regularly.  Exercise can help you maintain a healthy weight and improve your blood pressure and cholesterol levels. Regular exercise can also decrease your risk for heart problems. Ask your healthcare provider about the best exercise plan for you. Do not start an exercise program without asking your healthcare provider.       Follow up with your doctor or cardiologist as directed:  Write down your questions so you remember to ask them during your visits.   Copyright [employer] Information is for [employer] use only and may not be sold, redistributed or otherwise used for commercial purposes. All illustrations and images included in CareNotes are the copyrighted property of A.D.A.M., Inc. or [employer] [facility]  The above information is an educational aid only. It is not intended as medical advice for individual conditions or treatments. Talk to your doctor, nurse or pharmacist before following any medical regimen to see if it is safe and effective for you.        Follow-up: Follow up in about 1 year (around 07/13/2023) for Physical. , return sooner if needed.      Signed:  Georgia Lopes, MD

## 2022-07-14 ENCOUNTER — Telehealth: Payer: Self-pay

## 2022-07-14 ENCOUNTER — Other Ambulatory Visit: Payer: BLUE CROSS/BLUE SHIELD | Admitting: Family Medicine

## 2022-07-14 ENCOUNTER — Encounter: Payer: Self-pay | Admitting: Family Medicine

## 2022-07-14 ENCOUNTER — Other Ambulatory Visit: Payer: BLUE CROSS/BLUE SHIELD | Admitting: Student in an Organized Health Care Education/Training Program

## 2022-07-14 DIAGNOSIS — L905 Scar conditions and fibrosis of skin: Secondary | ICD-10-CM

## 2022-07-14 DIAGNOSIS — Z85831 Personal history of malignant neoplasm of soft tissue: Secondary | ICD-10-CM

## 2022-07-14 LAB — HEMOGLOBIN A1C: Hemoglobin A1C: 6.1 % — ABNORMAL HIGH

## 2022-07-14 NOTE — Telephone Encounter (Unsigned)
Copied from CRM [id number]. Topic: Return Call - Schedule Appointment  >> Jul 14, 2022  2:14 PM Kara Mead wrote:  Harry Fleming, Patient, is returning a call from Vernona Rieger and states this is regarding .    Writer unable to see anything open with any general derm. Please advise and reach out.     Tomorrow 8-4 pm is best patient said.     Patient can be reached at: [phone].

## 2022-07-14 NOTE — Telephone Encounter (Signed)
Spoke with patient, scheduled npv on 12/03 at 10:30am with Lynnell Chad.

## 2022-07-14 NOTE — Progress Notes (Signed)
Ordering Provider Response       Reviewed.

## 2022-07-14 NOTE — Provider Consult (Signed)
Consulting Provider Response     Dermatology E-Consult    Question/History  62 y.o. year old male with history of leiomyosarcoma that was removed in 2004.     MY CLINICAL QUESTION: Patient recently moved here.  He was following up with dermatology once a year due to his history of leiomyosarcoma.  He would like to start following up with dermatology once yearly for full body skin checks.  Leiomyosarcoma was from his right upper back area near his shoulder.     Photographs  Reviewed in Media Tab    Good    Please visit the following website to see an excellent and concise guide for taking medical photographs:  [email or web address]    Assessment    Well-healed linear scar without evidence of recurrence, other lesions appear to be melanocytic nevi without concerning features.     Plan  - We will plan to see routinely in clinic to establish care  - None of the pictured lesions are concerning for skin cancer at this time and in person evaluation and biopsy are not necessary  - Recommend wearing SPF 30+ sunscreen and sun protective clothing  - Minimize sun exposure between the hours of 10A and 3P, when the sun's rays are the strongest.   - Please monitor your moles and other skin once monthly for any:  Asymmetry  Border irregularity  Color change or multiple colors  Diameter that is changing  Evolution (any change at all).  - New lesions  - Also look for nonhealing spots, areas that bleed, pearly or particularly scaly spots.    - We do not expect these spots to resolve, as they are acquired skin changes. Please feel free to reach out if patient develops any concerning or rapidly changing lesions, as above.    Triage/Scheduling Instructions  Recommend Normal (next available)    Time Spent: 5 minutes    This eConsult is focused on the specific clinical question(s) asked by the referring clinician, is based on the clinical data available to me, the consulting physician at  the time off the request, and is furnished without benefit of a comprehensive evaluation of physical examination of the patient by me. The guidance set forth in the eConsult note will need to be interpreted in light of any clinical issues not known to me or any changes in patient status that I may not be aware of at the time of filing this eConsult. If further consultation is necessary, an in-person visit with me or another member of our group is an option.    Angelyn Punt, MD  Dermatology Attending

## 2022-07-16 ENCOUNTER — Telehealth: Payer: Self-pay

## 2022-07-16 NOTE — Telephone Encounter (Signed)
Called pt to offer move up appointment with Lynnell Chad.

## 2022-07-20 NOTE — Telephone Encounter (Signed)
Called pt and rescheduled to sooner appointment with Dermatology Ova Freshwater, MD) on Wednesday 10/07/2022 at 11:30 AM, [facility].

## 2022-08-07 ENCOUNTER — Other Ambulatory Visit: Payer: Self-pay | Admitting: Family Medicine

## 2022-08-07 NOTE — Telephone Encounter (Signed)
Last Appointment:  07/13/2022    Next Appointment: Visit date not found          Lab results: 07/13/22  1200   WBC 5.0   Hemoglobin 14.5   Hematocrit 44   RBC 4.8   Platelets 251        Lab Results   Component Value Date    HA1C 6.1 (H) 07/13/2022         Lab results: 07/13/22  1200   TSH 1.52           Lab results: 07/13/22  1200   Sodium 136   Potassium 4.4   Chloride 101   CO2 24   UN 16   Creatinine 1.09   Glucose 98   Calcium 9.3   Total Protein 7.4   Albumin 4.5   ALT 38   AST 29   Alk Phos 86   Bilirubin,Total 0.7

## 2022-08-24 NOTE — Progress Notes (Signed)
Cardiology Office Visit Note    Name: Harry Fleming Date of Visit: 08/28/2022   DOB: 11-28-1960 PCP: Georgia Lopes, MD     Reason for Today's Visit: Follow-up    History of Present Illness   Harry Fleming is a 62 y.o. male patient with pertinent medical history of:     # PFO s/p Amplatzer occluder device (2017)  # Hypertension  # Hyperlipidemia   # Prior TIA/CVA  # RCC s/p partial nephrectomy     Established care with cardiology in November 2023 with Aggie Cosier after moving from [place] to be closer to family.  He is expecting his first grandchild in the next month or so.  Felt to be doing well without any cardiovascular symptoms.  Continued on current regimen without change and arrange to have an echo which has yet to be completed.    Harry Fleming presents today in follow-up without any cardiovascular symptoms.  He reports that he has been traveling quite frequently over the past 1/2 year, so he is not surprised that his cholesterol numbers are a bit elevated on his most recent labs.  He is anticipating less travel over the next half year so we will focus more on his health.    Past Medical History   The patient's medical and surgical history were reviewed and pertinent history is included in the HPI as above.     Medications and Allergies     Current Outpatient Medications   Medication Sig    valsartan (DIOVAN) 160 MG tablet TAKE 1 TABLET(160 MG) BY MOUTH DAILY    atorvastatin (LIPITOR) 40 mg tablet Take 1 tablet (40 mg total) by mouth daily.    aspirin 81 mg EC tablet Take 1 tablet (81 mg total) by mouth daily     Allergies   Allergen Reactions    Amoxicillin Nausea And Vomiting    Cat Dander Other (See Comments)     Eyes swell.      Social and Family History   He reports that he has never smoked. He has never used smokeless tobacco. He reports current alcohol use of about 5.0 standard drinks of alcohol per week. He reports being sexually active and has had partner(s) who are male. He reports using the following  method of birth control/protection: None. He reports that he does not use drugs.    Family History   Problem Relation Age of Onset    High cholesterol Father     Hypertension Father     Cancer Father         Renal cell carcinoma    Kidney cancer Father 70        died from mets    Cancer Paternal Grandmother 3 - 46        gallbladder- died at age 42    High cholesterol Paternal Grandfather     Hypertension Paternal Grandfather     Pancreatic Cancer Paternal Grandfather 87    Pancreatic Cancer Maternal Aunt 60 - 69     Review of Systems   ROS: 10 of 14 systems reviewed and pertinent positives and negatives documented as above in HPI; otherwise negative.     Vitals and Physical Exam   BP 126/88 (BP Location: Left arm, Patient Position: Sitting, Cuff Size: adult)   Pulse 70   Ht 1.829 m (6')   Wt 107.1 kg (236 lb 3.2 oz)   SpO2 95%   BMI 32.03 kg/m  Body mass index is 32.03 kg/m.  Constitutional: Appears well, no acute distress.   Cardiovascular: RRR, normal s1s2 appreciated.   No murmurs.  No lower extremity edema.  2+ radial pulses.   Respiratory: Breathing comfortably on room air.   CTAB.   No wheezes, rhonchi, rales.  Skin: No rashes, lesions on exposed skin.   Neurologic: Awake, alert, answering questions appropriately.   Moving all extremities.     Cardiac Studies                 Recent laboratory data independently reviewed.  Impression and Plan   Harry Fleming is a 62 y.o. male with medical history most pertinent for PFO s/p Amplatzer occluder device, hypertension, hyperlipidemia, prior TIA/CVA, and prior RCC s/p partial nephrectomy presenting in follow-up.     # PFO s/p Amplatzer occluder:    -Continue aspirin 81 mg daily   -Echo pending     # Essential hypertension: Goal blood pressure < 130/80 based on patient's risk profile.  At goal on current therapy.    -Continue valsartan 160 mg daily    # Hyperlipidemia: Goal LDL < 70 based on patient's history.  Above goal on current therapy.  Patient prefers to  make lifestyle adjustments before increasing his atorvastatin.   -Continue atorvastatin 40 mg daily   -Repeat lipid panel in 3 months    Follow-up: 1 year    Thank you for the opportunity to participate in the care of your patient.  Please do not hesitate to contact me ([email or web address]) if you have any further questions or concerns.    Serafina Mitchell, MD  08/28/2022 8:11 AM

## 2022-08-28 ENCOUNTER — Encounter: Payer: Self-pay | Admitting: Internal Medicine

## 2022-08-28 ENCOUNTER — Ambulatory Visit: Payer: BLUE CROSS/BLUE SHIELD | Admitting: Internal Medicine

## 2022-08-28 ENCOUNTER — Other Ambulatory Visit: Payer: Self-pay

## 2022-08-28 VITALS — BP 126/88 | HR 70 | Ht 72.0 in | Wt 236.2 lb

## 2022-08-28 DIAGNOSIS — Z8774 Personal history of (corrected) congenital malformations of heart and circulatory system: Secondary | ICD-10-CM

## 2022-08-28 DIAGNOSIS — I10 Essential (primary) hypertension: Secondary | ICD-10-CM

## 2022-08-28 DIAGNOSIS — E785 Hyperlipidemia, unspecified: Secondary | ICD-10-CM

## 2022-10-07 ENCOUNTER — Ambulatory Visit: Payer: BLUE CROSS/BLUE SHIELD | Attending: Dermatology | Admitting: Dermatology

## 2022-10-07 ENCOUNTER — Other Ambulatory Visit: Payer: Self-pay

## 2022-10-07 VITALS — Wt 236.0 lb

## 2022-10-07 DIAGNOSIS — L821 Other seborrheic keratosis: Secondary | ICD-10-CM

## 2022-10-07 DIAGNOSIS — D229 Melanocytic nevi, unspecified: Secondary | ICD-10-CM

## 2022-10-07 DIAGNOSIS — L219 Seborrheic dermatitis, unspecified: Secondary | ICD-10-CM

## 2022-10-07 MED ORDER — HYDROCORTISONE 2.5 % EX CREA *I*
TOPICAL_CREAM | Freq: Every day | CUTANEOUS | 2 refills | Status: AC | PRN
Start: 2022-10-07 — End: 2022-11-06

## 2022-10-07 MED ORDER — KETOCONAZOLE 2 % EX SHAM *I*
MEDICATED_SHAMPOO | CUTANEOUS | 11 refills | Status: AC
Start: 2022-10-07 — End: ?

## 2022-10-07 MED ORDER — KETOCONAZOLE 2 % EX CREA *I*
TOPICAL_CREAM | Freq: Every day | CUTANEOUS | 11 refills | Status: AC
Start: 2022-10-07 — End: 2022-11-06

## 2022-10-07 NOTE — Progress Notes (Addendum)
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 male who presents alone here for Following concern(s) noted below   and Referral . First visit today .    Last Visit (No previous visit found/year):       Concern(s) Today (10/07/22):  Problem-Bump(s)   -Loc: bilateral posterior calves   -Time:weeks   -Sx: Asymptomatic  -Mod Factors: none     ?  Social Hx: Never smoker and Uses sunscreen   Skin Cancer PMH: No   Skin Cancer FH: No     Physical Exam:   Vitals:    10/07/22 1143   Weight: 107 kg (236 lb)     Skin: All of the following were examined, and were within normal limits, except as noted: Face, Ears, Scalp/Hair, Eyes/Eyelids, Lips, Neck, Chest, Back, Abdomen, Extremities (RUE/LUE), Extremities (RLE/LLE), Digits/Nails, Buttocks, and declined groin exam today   - Right scapula: WHSS    -Sharply-demarcated, thin, dull-red patches with thin flaky/greasy scale around nasolabial fold and forehead   - Bilateral posterior calf x2: flesh-colored  hyperkeratotic, greasy, papules   - Scattered well defined, symmetric, light to dark brown macules and papules with regular borders and even pigmentation consistent with benign melanocytic nevi    Labs:  N/A    Assessment/Plan:    Seborrheic dermatitis: face  -Diagnosis and treatment options discussed  - Start ketoconazole (NIZORAL) 2 % cream; Apply topically daily. Apply to affected areas on the face daily.  Dispense: 60 g; Refill: 11  - Start ketoconazole (NIZORAL) 2 % shampoo; Apply to wet scalp, lather, leave on 3 to 5 minutes, and rinse. Use at least 2-3 times weekly.  Dispense: 120 mL; Refill: 11  - Start hydrocortisone 2.5 % cream; Apply topically daily as needed. On the face  Dispense: 28 g; Refill: 2      -Do not use on face,  underarms, or groin   -Side effects: lightening or thinning of the skin, especially if used on areas where you don't have rash       Seborrheic keratosis  - These growths are benign  - Related to genetics - these lesions run in families; NOT related to sun exposure  - No treatment warranted but can be treated if irritating/bothersome    [place]evi, Benign:  -Benign nature discussed with patient and reassurance provided  -No treatment necessary  -Sun protection with sun avoidance, sun protective clothing and sunscreen (SPF 30 or greater, broad spectrum) recommended  -Suggest patient check his/her own skin once monthly to look for concerning lesions, including non-healing, tender, or bleeding lesions, or new or changing moles  -Monitor moles for the following changes:    A: asymmetry   B: border irregularity   C: color changes, multiple different colors (esp. 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 I agree with the resident's/fellow's findings and plan of care as documented.   Ova Freshwater, MD

## 2022-10-07 NOTE — Patient Instructions (Signed)
1. Seborrheic dermatitis (Primary)  - Start ketoconazole (NIZORAL) 2 % cream; Apply topically daily. Apply to affected areas on the face daily.  Dispense: 60 g; Refill: 11  - Start ketoconazole (NIZORAL) 2 % shampoo; for Tinea Versicolor. Apply to wet scalp, lather, leave on 3 to 5 minutes, and rinse. Use at least 2-3 times weekly.  Dispense: 120 mL; Refill: 11  - Start hydrocortisone 2.5 % cream; Apply topically daily as needed. On the face  Dispense: 28 g; Refill: 2

## 2022-10-08 ENCOUNTER — Encounter: Payer: Self-pay | Admitting: Student in an Organized Health Care Education/Training Program

## 2022-10-09 ENCOUNTER — Ambulatory Visit
Admission: RE | Admit: 2022-10-09 | Discharge: 2022-10-09 | Disposition: A | Discharge status: Home / Self Care | Payer: BLUE CROSS/BLUE SHIELD | Source: Ambulatory Visit | Attending: Internal Medicine | Admitting: Internal Medicine

## 2022-10-09 ENCOUNTER — Other Ambulatory Visit: Payer: Self-pay

## 2022-10-09 ENCOUNTER — Telehealth: Payer: Self-pay | Admitting: Internal Medicine

## 2022-10-09 DIAGNOSIS — Z8774 Personal history of (corrected) congenital malformations of heart and circulatory system: Secondary | ICD-10-CM | POA: Insufficient documentation

## 2022-10-09 LAB — ECHO COMPLETE
Aortic Arch Diameter: 3 cm
Aortic Diameter (mid tubular): 3.5 cm
Aortic Diameter (sinus of Valsalva): 3.8 cm
BMI: 32.1 kg/m2
BP Diastolic: 89 mmHg
BP Systolic: 132 mmHg
BSA: 2.33 m2
Deceleration Time - MV: 228 ms
E/A ratio: 0.7
Echo RV Stroke Work Index Estimate: 694.6 mmHg•mL/m2
Heart Rate: 67 {beats}/min
Height: 72 in
IVC Diameter: 1.7 cm
LA Systolic Vol BSA Index: 30.9 mL/m2
LA Systolic Vol Height Index: 39.4 mL/m
LA Systolic Volume: 72 mL
LV ASE Mass BSA Index: 60.7 gm/m2
LV ASE Mass Height 2.7 Index: 27.7 gm/m2.7
LV ASE Mass Height Index: 77.4 gm/m
LV ASE Mass: 141.5 gm
LV CO BSA Index: 2.39 L/min/m2
LV Cardiac Output: 5.56 L/min
LV Diastolic Volume Index: 58.4 mL/m2
LV Posterior Wall Thickness: 1 cm
LV SV - LVOT SV Diff: -3.79 mL
LV SV - LVOT SV Diff: -4.57 %
LV SV BSA Index: 35.6 mL/m2
LV SV Height Index: 45.4 mL/m
LV Septal Thickness: 1.1 cm
LV Stroke Volume: 83 mL
LV Systolic Volume Index: 22.7 mL/m2
LV wall/cavity ratio: 0.51
LVED Diameter BSA Index: 1.76 cm/m2
LVED Diameter Height Index: 2.24 cm/m
LVED Diameter: 4.1 cm
LVED Volume BSA Index: 58 ml/m2
LVED Volume BSA Index: 58.4 mL/m2
LVED Volume Height Index: 74.4 mL/m
LVED Volume: 136 mL
LVEF (Volume): 61 %
LVES Volume BSA Index: 22.7 mL/m2
LVES Volume BSA Index: 23 ml/m2
LVES Volume Height Index: 29 mL/m
LVES Volume: 53 mL
LVOT Area (calculated): 4.15 cm2
LVOT Cardiac Index: 2.5 L/min/m2
LVOT Cardiac Output: 5.81 L/min
LVOT Diameter: 2.3 cm
LVOT PWD VTI: 20.9 cm
LVOT PWD Velocity (mean): 64.8 cm/s
LVOT PWD Velocity (peak): 97.9 cm/s
LVOT SV BSA Index: 37.25 mL/m2
LVOT SV Height Index: 47.5 mL/m
LVOT Stroke Rate (mean): 269.1 mL/s
LVOT Stroke Rate (peak): 406.5 mL/s
LVOT Stroke Volume: 86.79 cc
MR Regurgitant Fraction (LV SV Mtd): -0.05
MR Regurgitant Volume (LV SV Mtd): -3.8 mL
MV Peak A Velocity: 86.5 cm/s
MV Peak E Velocity: 60.7 cm/s
Mitral Annular E/Ea Vel Ratio: 10.12
Mitral Annular Ea Velocity: 6 cm/s
Peak Gradient - TR: 19.5 mmHg
Peak Velocity - TR: 233.02 cm/s
Pressure Half-Time - AR: 630 ms
Pulmonary Vascular Resistance Estimate: 3.5 mmHg
RA Pressure Estimate: 5 mmHg
RA Volume BSA Index: 17.2 mL/m2
RA Volume Height Index: 21.9 mL/m
RA Volume: 40 mL
RR Interval: 895.52 ms
RV Peak Systolic Pressure: 24.5 mmHg
SEM (LVOT Mean) mN-s: 59.09 mN-s
SEM (LVOT peak) mN-s: 89.27 mN-s
Weight (lbs): 236 [lb_av]
Weight: 3776 oz

## 2022-10-09 MED ORDER — AGITATED SODIUM CHLORIDE 0.9 % INJ (FOR ECHO) *I*
15.0000 mL | INTRAMUSCULAR | Status: AC | PRN
Start: 2022-10-09 — End: 2022-10-09
  Administered 2022-10-09: 15 mL via INTRAVENOUS

## 2022-10-09 MED ORDER — PERFLUTREN PROTEIN A MICROSPH (OPTISON) IV SUSP *I*
1.5000 mL | INTRAVENOUS | Status: DC | PRN
Start: 2022-10-09 — End: 2022-10-10
  Administered 2022-10-09: 1.5 mL via INTRAVENOUS

## 2022-10-09 NOTE — Telephone Encounter (Signed)
Patient called in to say he is going to be up to 15 minutes late for Echocardiogram appointment  due to traffic.

## 2022-10-12 ENCOUNTER — Telehealth: Payer: Self-pay | Admitting: Student in an Organized Health Care Education/Training Program

## 2022-10-12 NOTE — Telephone Encounter (Signed)
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 Patient states he has already been tested for this and found to not be a carrier.

## 2022-11-22 ENCOUNTER — Other Ambulatory Visit: Payer: Self-pay | Admitting: Family Medicine

## 2022-11-23 MED ORDER — VALSARTAN 160 MG PO TABS *I*
160.0000 mg | ORAL_TABLET | Freq: Every day | ORAL | 1 refills | Status: DC
Start: 2022-11-23 — End: 2023-05-19

## 2022-11-23 NOTE — Telephone Encounter (Signed)
Last Appointment:  Visit date not found    Next Appointment: 07/19/23          Lab results: 07/13/22  1200   WBC 5.0   Hemoglobin 14.5   Hematocrit 44   RBC 4.8   Platelets 251        Lab Results   Component Value Date    HA1C 6.1 (H) 07/13/2022         Lab results: 07/13/22  1200   TSH 1.52           Lab results: 07/13/22  1200   Sodium 136   Potassium 4.4   Chloride 101   CO2 24   UN 16   Creatinine 1.09   Glucose 98   Calcium 9.3   Total Protein 7.4   Albumin 4.5   ALT 38   AST 29   Alk Phos 86   Bilirubin,Total 0.7

## 2022-12-01 ENCOUNTER — Other Ambulatory Visit: Payer: Self-pay | Admitting: Family Medicine

## 2022-12-01 NOTE — Telephone Encounter (Signed)
Last Appointment:  Visit date not found    Next Appointment: Visit date not found          Lab results: 07/13/22  1200   WBC 5.0   Hemoglobin 14.5   Hematocrit 44   RBC 4.8   Platelets 251        Lab Results   Component Value Date    HA1C 6.1 (H) 07/13/2022         Lab results: 07/13/22  1200   TSH 1.52           Lab results: 07/13/22  1200   Sodium 136   Potassium 4.4   Chloride 101   CO2 24   UN 16   Creatinine 1.09   Glucose 98   Calcium 9.3   Total Protein 7.4   Albumin 4.5   ALT 38   AST 29   Alk Phos 86   Bilirubin,Total 0.7

## 2022-12-29 ENCOUNTER — Ambulatory Visit: Payer: BLUE CROSS/BLUE SHIELD | Admitting: Student in an Organized Health Care Education/Training Program

## 2023-01-01 ENCOUNTER — Other Ambulatory Visit: Payer: Self-pay

## 2023-01-02 ENCOUNTER — Other Ambulatory Visit: Payer: Self-pay

## 2023-01-02 ENCOUNTER — Ambulatory Visit
Admission: RE | Admit: 2023-01-02 | Discharge: 2023-01-02 | Disposition: A | Discharge status: Home / Self Care | Payer: BLUE CROSS/BLUE SHIELD | Source: Ambulatory Visit | Attending: Urology | Admitting: Urology

## 2023-01-02 DIAGNOSIS — Z905 Acquired absence of kidney: Secondary | ICD-10-CM | POA: Insufficient documentation

## 2023-01-02 DIAGNOSIS — N281 Cyst of kidney, acquired: Secondary | ICD-10-CM | POA: Insufficient documentation

## 2023-01-02 DIAGNOSIS — C649 Malignant neoplasm of unspecified kidney, except renal pelvis: Secondary | ICD-10-CM

## 2023-01-02 DIAGNOSIS — Z85528 Personal history of other malignant neoplasm of kidney: Secondary | ICD-10-CM | POA: Insufficient documentation

## 2023-01-02 MED ORDER — GADOPICLENOL 0.5 MMOL/ML (VUEWAY) IV SOLN *I*
0.1000 mL/kg | Freq: Once | INTRAVENOUS | Status: AC
Start: 2023-01-02 — End: 2023-01-02
  Administered 2023-01-02: 10.6 mL via INTRAVENOUS

## 2023-02-21 ENCOUNTER — Encounter: Payer: Self-pay | Admitting: Urology

## 2023-03-05 ENCOUNTER — Other Ambulatory Visit: Payer: Self-pay | Admitting: Urology

## 2023-03-05 DIAGNOSIS — R3989 Other symptoms and signs involving the genitourinary system: Secondary | ICD-10-CM

## 2023-03-08 ENCOUNTER — Encounter: Payer: Self-pay | Admitting: Urology

## 2023-03-08 ENCOUNTER — Ambulatory Visit: Payer: BLUE CROSS/BLUE SHIELD | Attending: Urology | Admitting: Urology

## 2023-03-08 ENCOUNTER — Other Ambulatory Visit: Payer: Self-pay

## 2023-03-08 DIAGNOSIS — Z85528 Personal history of other malignant neoplasm of kidney: Secondary | ICD-10-CM

## 2023-03-08 DIAGNOSIS — N281 Cyst of kidney, acquired: Secondary | ICD-10-CM

## 2023-03-08 LAB — POCT URINALYSIS DIPSTICK
Blood,UA POCT: NEGATIVE
Glucose,UA POCT: NORMAL mg/dL
Ketones,UA POCT: NEGATIVE mg/dL
Leuk Esterase,UA POCT: NEGATIVE
Lot #: 81534103
Nitrite,UA POCT: NEGATIVE
PH,UA POCT: 6 (ref 5–8)
Specific gravity,UA POCT: 1.02 (ref 1.002–1.030)

## 2023-03-08 NOTE — Progress Notes (Signed)
Chief complaint: h/o RCC    History of present illness:  HPI  Harry Fleming here with wife to discuss recent MRI results.  Harry Fleming has hx of RCC s/p left partial nephrectomy.  Recent genetic testing was negative.  Has not seen any blood in his urine.  Denies urinary problems.  Has had no pain.       Interim:   Doing well. No new urinary complaints. No blood in the urine. No flank pain.    ROS-Urology    Questionnaire Scores:     Urology Questionnaire Scores          01/01/2022 11/24/2021   Urology Questionnaire Scores   AUA Symptom Total 2 1   If you were to spend the rest of your life with your urinary condition just the way it is now, how would you feel about that? 5 5     Current Outpatient Medications   Medication    atorvastatin (LIPITOR) 40 mg tablet    valsartan (DIOVAN) 160 MG tablet    ketoconazole (NIZORAL) 2 % shampoo    aspirin 81 mg EC tablet     No current facility-administered medications for this visit.     Allergies   Allergen Reactions    Amoxicillin Nausea And Vomiting    Cat Dander Other (See Comments)     Eyes swell.        Past medical history:   Past Medical History:   Diagnosis Date    CVA (cerebral vascular accident)     H/O Amplatzer atrial septal defect closure 09/2015    For PFO    High blood pressure     History of renal cell carcinoma     HLD (hyperlipidemia)     Hypertension     Leiomyosarcoma     Shoulder    PFO (patent foramen ovale)     Renal lesion     TIA (transient ischemic attack) 2017    Trigeminy 09/08/2021       Past surgical history:   Past Surgical History:   Procedure Laterality Date    PARTIAL NEPHRECTOMY         Family history: Urological-related family history includes Kidney cancer (age of onset: 49) in his father.    Social History:   Social History     Socioeconomic History    Marital status: Married   Tobacco Use    Smoking status: Never    Smokeless tobacco: Never   Substance and Sexual Activity    Alcohol use: Yes     Alcohol/week: 5.0 standard drinks of alcohol     Types: 5 Shots  of liquor per week     Comment: 5 glasses of bourbon a week.    Drug use: Never    Sexual activity: Yes     Partners: Female     Birth control/protection: None         Vital signs: There were no vitals taken for this visit.    Recent Results (from the past 24 hours)   POCT urinalysis dipstick    Collection Time: 03/08/23 11:40 AM   Result Value Ref Range    Specific gravity,UA POCT 1.020 1.002 - 1.030    PH,UA POCT 6.0 5 - 8    Leuk Esterase,UA POCT Negative Negative, Test Not Performed    Nitrite,UA POCT Negative Negative, Test Not Performed    Protein,UA POCT Test Not Performed Negative, Test Not Performed mg/dL    Glucose,UA POCT Normal Normal mg/dL  Ketones,UA POCT Negative Negative mg/dL    Urobilinogen,UA Test Not Performed Less than 1 mg/dL    Bilirubin,Ur Test Not Performed Negative, Test Not Performed    Blood,UA POCT Negative Negative, Test Not Performed    Exp date 1.31.26     Lot # [id number]      MRI abdomen 12/18/21:   No prior imaging available for comparison.      Partial nephrectomy changes appears to be in the LEFT kidney rather than the RIGHT. Recommend correlation with outside medical record.      Additional Bosniak 1/2 renal cysts bilaterally as detailed above.      END OF IMPRESSION     MR Abd w/wo 01/05/2023   Status post partial left nephrectomy for renal cell carcinoma with development of new anterior 1 cm left renal cyst. This demonstrates no enhancement or obvious septation. Recommend follow up MRI in 3 months to reassess.      Multiple right renal cysts are stable compared to the prior exam.     Lab Results   Component Value Date    PSAR3 1.05 07/13/2022    PSAR3 0.88 10/07/2021      Assessment: History of left partial nephrectomy with no evidence of disease recurrence on recent imaging.  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       Plan: renal US in 3 months and 1 year with abdominal MRI.

## 2023-03-08 NOTE — Patient Instructions (Addendum)
I will reach out with Renal US.     Your provider has requested you follow up with imaging.      Our office requests that you schedule your own appointment by calling [facility] at the number below.  The imaging facility will reach out to your insurance company to request authorization to complete this testing.     If you do not schedule your imaging to be completed prior to your next visit, your follow up may be delayed.     X Rays do not need to be scheduled - these are walk in.     [facility]:  [phone]

## 2023-05-01 ENCOUNTER — Encounter: Payer: Self-pay | Admitting: Urology

## 2023-05-01 DIAGNOSIS — Z85528 Personal history of other malignant neoplasm of kidney: Secondary | ICD-10-CM

## 2023-05-01 DIAGNOSIS — N281 Cyst of kidney, acquired: Secondary | ICD-10-CM

## 2023-05-04 NOTE — Telephone Encounter (Signed)
 Renal US Ordered for May. Please help. Thank you

## 2023-05-19 ENCOUNTER — Other Ambulatory Visit: Payer: Self-pay | Admitting: Family Medicine

## 2023-05-19 NOTE — Telephone Encounter (Signed)
 Last Appointment:  Visit date not found    Next Appointment: 07/19/2023          Lab results: 07/13/22  1200   WBC 5.0   Hemoglobin 14.5   Hematocrit 44   RBC 4.8   Platelets 251        Lab Results   Component Value Date    HA1C 6.1 (H) 07/13/2022         Lab results: 07/13/22  1200   TSH 1.52           Lab results: 07/13/22  1200   Sodium 136   Potassium 4.4   Chloride 101   CO2 24   UN 16   Creatinine 1.09   Glucose 98   Calcium  9.3   Total Protein 7.4   Albumin 4.5   ALT 38   AST 29   Alk Phos 86   Bilirubin,Total 0.7

## 2023-05-27 ENCOUNTER — Ambulatory Visit
Admission: RE | Admit: 2023-05-27 | Discharge: 2023-05-27 | Disposition: A | Discharge status: Home / Self Care | Source: Ambulatory Visit | Attending: Urology | Admitting: Urology

## 2023-05-27 ENCOUNTER — Other Ambulatory Visit: Payer: Self-pay

## 2023-05-27 DIAGNOSIS — N281 Cyst of kidney, acquired: Secondary | ICD-10-CM | POA: Insufficient documentation

## 2023-05-27 DIAGNOSIS — Z85528 Personal history of other malignant neoplasm of kidney: Secondary | ICD-10-CM | POA: Insufficient documentation

## 2023-05-30 ENCOUNTER — Encounter: Payer: Self-pay | Admitting: Urology

## 2023-05-30 DIAGNOSIS — Z85528 Personal history of other malignant neoplasm of kidney: Secondary | ICD-10-CM

## 2023-05-30 DIAGNOSIS — N281 Cyst of kidney, acquired: Secondary | ICD-10-CM

## 2023-05-31 ENCOUNTER — Other Ambulatory Visit: Payer: Self-pay | Admitting: Urology

## 2023-05-31 ENCOUNTER — Ambulatory Visit: Payer: Self-pay | Admitting: Urology

## 2023-05-31 DIAGNOSIS — Z85528 Personal history of other malignant neoplasm of kidney: Secondary | ICD-10-CM

## 2023-05-31 DIAGNOSIS — N281 Cyst of kidney, acquired: Secondary | ICD-10-CM

## 2023-05-31 NOTE — Addendum Note (Signed)
 Addended by: Audrene Lease E on: 05/31/2023 02:04 PM     Modules accepted: Orders

## 2023-07-12 ENCOUNTER — Ambulatory Visit
Admission: RE | Admit: 2023-07-12 | Discharge: 2023-07-12 | Disposition: A | Discharge status: Home / Self Care | Source: Ambulatory Visit | Attending: Urology | Admitting: Urology

## 2023-07-12 ENCOUNTER — Other Ambulatory Visit: Payer: Self-pay

## 2023-07-12 DIAGNOSIS — Z85528 Personal history of other malignant neoplasm of kidney: Secondary | ICD-10-CM | POA: Insufficient documentation

## 2023-07-12 DIAGNOSIS — N281 Cyst of kidney, acquired: Secondary | ICD-10-CM | POA: Insufficient documentation

## 2023-07-12 MED ORDER — GADOPICLENOL 0.5 MMOL/ML (VUEWAY) IV SOLN *I*
0.1000 mL/kg | Freq: Once | INTRAVENOUS | Status: AC
Start: 2023-07-12 — End: 2023-07-12
  Administered 2023-07-12: 10.75 mL via INTRAVENOUS

## 2023-07-13 ENCOUNTER — Ambulatory Visit: Payer: Self-pay | Admitting: Urology

## 2023-07-19 ENCOUNTER — Other Ambulatory Visit: Payer: Self-pay

## 2023-07-19 ENCOUNTER — Ambulatory Visit: Payer: BLUE CROSS/BLUE SHIELD | Admitting: Family Medicine

## 2023-07-19 ENCOUNTER — Encounter: Payer: Self-pay | Admitting: Family Medicine

## 2023-07-19 VITALS — BP 130/82 | Ht 72.0 in | Wt 233.0 lb

## 2023-07-19 DIAGNOSIS — R7303 Prediabetes: Secondary | ICD-10-CM

## 2023-07-19 DIAGNOSIS — Z23 Encounter for immunization: Secondary | ICD-10-CM

## 2023-07-19 DIAGNOSIS — Z0000 Encounter for general adult medical examination without abnormal findings: Secondary | ICD-10-CM

## 2023-07-19 DIAGNOSIS — E785 Hyperlipidemia, unspecified: Secondary | ICD-10-CM

## 2023-07-19 DIAGNOSIS — I10 Essential (primary) hypertension: Secondary | ICD-10-CM

## 2023-07-19 DIAGNOSIS — Z125 Encounter for screening for malignant neoplasm of prostate: Secondary | ICD-10-CM

## 2023-07-19 NOTE — Progress Notes (Signed)
 Adult Physical    Harry Fleming is a 63 y.o.male  here for an annual exam today.  Patient also has the following concerns that were addressed today:    Chief Complaint   Patient presents with    Annual Exam        1.  Hypertension/dyslipidemia/prediabetes  Patient has a history of PFO s/p Amplatzer occluder device (2017) , hypertension, TIA, history of renal cell carcinoma status post partial nephrectomy, leiomyosarcoma, and trigeminy.  Patient is exercising and tries to be adherent to a low-salt diet.  Patient denies: chest pain, chest pressure/discomfort, claudication, dyspnea, exertional chest pressure/discomfort, lower extremity edema, near-syncope, palpitations, syncope and tachypnea. Cardiovascular risk factors: advanced age (older than 52 for men, 23 for women), hypertension and male gender. Use of agents associated with hypertension: none.  Patient is currently on valsartan  160 mg daily and atorvastatin  40 mg daily.  Patient does follow with cardiology.  Last hemoglobin A1c was 6.1.    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/2021    TIA (transient ischemic attack) 12/15/2021    CVA (cerebral vascular accident) 12/15/2021    HLD (hyperlipidemia) 10/21/2021    History of renal cell carcinoma 09/08/2021    Hypertension 09/08/2021    Renal lesion 09/08/2021    Trigeminy 09/08/2021    H/O partial nephrectomy 09/08/2021      Past Medical History:   Diagnosis Date    CVA (cerebral vascular accident)     H/O Amplatzer atrial septal defect closure 09/2015    For PFO    High blood pressure     History of renal cell carcinoma     HLD (hyperlipidemia)     Hypertension     Leiomyosarcoma     Shoulder    PFO (patent foramen ovale)     Renal lesion     TIA (transient ischemic attack) 2017    Trigeminy 09/08/2021       Past Surgical History:   Procedure Laterality Date    PARTIAL NEPHRECTOMY       Current Outpatient Medications   Medication    valsartan  (DIOVAN ) 160 MG tablet    atorvastatin  (LIPITOR) 40 mg tablet    ketoconazole  (NIZORAL ) 2 % shampoo    aspirin 81 mg EC tablet     No current facility-administered medications for this visit.     Allergies[1]  Family History   Problem Relation Age of Onset    High cholesterol Father     Hypertension Father     Cancer Father         Renal cell carcinoma    Kidney cancer Father 79        died from mets    Cancer Paternal Grandmother 26 - 58        gallbladder- died at age 25    High cholesterol Paternal Grandfather     Hypertension Paternal Grandfather     Pancreatic Cancer Paternal Grandfather 68    Pancreatic Cancer Maternal Aunt 15 - 35     Social History[2]       Health Maintenance  Updated today, see HMP section in eRecord.    ROS    Constitutional:  --negative for fever and chills   --negative for unintentional weight loss  Eyes:  -- negative  for blurred vision  --negative for redness/irritation  ENMT:   --negative for congestion  --negative for sore throat or mouth lesions  Respiratory:   --negative for shortness of breath  --negative for hemoptysis  Cardiovascular:  --negative for chest pain  --negative for orthopnea  Gastrointestinal:  --negative for abdominal pain  --negative for melena or hematochezia  Genitourinary:  --negative for pain with urination  --negative for hematuria  Heme/Lymph:  --negative for swollen lymph nodes  --negative for easy bruising  Skin/Breast:  --negative for rashes  Musculoskeletal:  --no joint deformity  --no joint swelling or redness  Neurologic:  --no focal muscle weakness  --no problems with speech or swallowing  Allergy/Imm:  --no hives  --no excessively frequent illnesses       PHYSICAL EXAM  BP 130/82   Pulse (P) 80   Ht 1.829 m (6')   Wt 105.7 kg (233 lb)   BMI 31.60 kg/m   Body mass index is 31.6 kg/m.     Constitutional:  Well-developed, cooperative, no acute distress.  Answers questions appropriately.  Head: Normocephalic and atraumatic.   Right Ear: Tympanic membrane normal. Good light reflex, no bulging or retraction.  Left Ear: Tympanic membrane normal. Good light reflex, no bulging or retraction.  Mouth/Throat: Mucous membranes are moist. Oropharynx is clear.  Good dentition.  Eyes: Conjunctivae and EOM are normal.   Neck: Normal range of motion. No cervical adenopathy.    Cardiovascular: Regular rate and rhythm. No murmur, rub or gallop appreciated.   Pulmonary/Chest: Effort normal and breath sounds normal.  Lungs are clear to auscultation bilaterally without rales, wheezing, or rhonchi.  Abdomen: soft, non-tender, non-distended, no masses, no hepatosplenomegaly appreciated; no rebound or guarding; positive bowel sounds all 4 quadrants   Musculoskeletal: Normal range of motion and non antalgic gait.  Neurological: Alert with normal strength and normal reflexes. Coordination and gait normal.   Skin: Skin is warm. No cyanosis.   Extremities: No peripheral edema.      All labs from the past 90 days   No visits with results within 90 Day(s) from this visit.   Latest known visit with results is:   PA Office Visit on 03/08/2023   Component Date Value    Specific gravity,UA POCT 03/08/2023 1.020     PH,UA POCT 03/08/2023 6.0     Leuk Esterase,UA POCT 03/08/2023 Negative     Nitrite,UA POCT 03/08/2023 Negative     Protein,UA POCT 03/08/2023 Test Not Performed     Glucose,UA POCT 03/08/2023 Normal     Ketones,UA POCT 03/08/2023 Negative     Urobilinogen,UA 03/08/2023 Test Not Performed     Bilirubin,Ur 03/08/2023 Test Not Performed     Blood,UA POCT 03/08/2023 Negative     Exp date 03/08/2023 1.31.26     Lot # 03/08/2023 [id number]        ASSESSMENT and PLAN    Harry Fleming is a 63 y.o. male here for annual physical exam.  The following medical problems were also reviewed and addressed today:    Hypertension/hyperlipidemia  Continue  valsartan  160 mg daily and Lipitor 40 mg daily  Continue appropriate follow-up with cardiology.    Prediabetes  Repeat hemoglobin A1c    Patient [place]  We will have to update his records to reflect this.    PSA screening ordered.    Patient follows with urology for history of RCC.  Monitoring with MRIs.    Follows with dermatology for history of  leiomyosarcoma.    Immunizations:   Immunization History   Administered Date(s) Administered    Pneumococcal 20-valent Conj vaccine 07/19/2023       Weight management: Body mass index is 31.6 kg/m.  Discussed the patient's BMI with him. The BMI is in the acceptable range.      See orders for today for labs and referrals.  Orders Placed This Encounter   Procedures    Pneumococcal conjugate vaccine 20 (Prevnar 20)    CBC    Comprehensive metabolic panel    Hemoglobin A1c    Vitamin D     PSA (eff.04-2008)    Vitamin B12        The following patient instructions were reviewed verbally with the patient, printed on their after visit summary and given to them at the end of today's visit.     Heart Healthy Diet   WHAT YOU NEED TO KNOW:   A heart healthy diet is an eating plan low in unhealthy fats and sodium (salt). The plan is high in healthy fats and fiber. A heart healthy diet helps improve your cholesterol levels and lowers your risk for heart disease and stroke. A dietitian will teach you how to read and understand food labels.  DISCHARGE INSTRUCTIONS:   Heart healthy diet guidelines to follow:   Choose foods that contain healthy fats.      Unsaturated fats  include monounsaturated and polyunsaturated fats. Unsaturated fat is found in foods such as soybean, canola, olive, corn, and safflower oils. It is also found in soft tub margarine that is made with liquid vegetable oil.    Omega-3 fat  is found in certain fish, such as salmon, tuna, and trout, and in walnuts and flaxseed. Eat fish high in omega-3 fats at least 2 times a  week.       Get 20 to 30 grams of fiber each day.  Fruits, vegetables, whole-grain foods, and legumes (cooked beans) are good sources of fiber.         Limit or do not have unhealthy fats.      Cholesterol  is found in animal foods, such as eggs and lobster, and in dairy products made from whole milk. Limit cholesterol to less than 200 mg each day.    Saturated fat  is found in meats, such as bacon and hamburger. It is also found in chicken or [place] skin, whole milk, and butter. Limit saturated fat to less than 7% of your total daily calories.    Trans fat  is found in packaged foods, such as potato chips and cookies. It is also in hard margarine, some fried foods, and shortening. Do not eat foods that contain trans fats.    Limit sodium as directed.  You may be told to limit sodium to 2,000 to 2,300 mg each day. Choose low-sodium or no-salt-added foods. Add little or no salt to food you prepare. Use herbs and spices in place of salt.       Include the following in your heart healthy plan:  Ask your dietitian or healthcare provider how many servings to have from each of the following food groups:  Grains:      Whole-wheat breads, cereals, and pastas, and brown rice    Low-fat, low-sodium crackers and chips    Vegetables:      Broccoli, green beans, green peas, and spinach    Collards, kale, and lima beans    Carrots, sweet potatoes, tomatoes, and peppers  Canned vegetables with no salt added    Fruits:      Bananas, peaches, pears, and pineapple    Grapes, raisins, and dates    Oranges, tangerines, grapefruit, orange juice, and grapefruit juice    Apricots, mangoes, melons, and papaya    Raspberries and strawberries    Canned fruit with no added sugar    Low-fat dairy:      Nonfat (skim) milk, 1% milk, and low-fat almond, cashew, or soy milks fortified with calcium     Low-fat cheese, regular or frozen yogurt, and cottage cheese    Meats and proteins:      Lean cuts of beef and pork (loin, leg, round), skinless  chicken and [place]    Legumes, soy products, egg whites, or nuts    Limit or do not include the following in your heart healthy plan:   Unhealthy fats and oils:      Whole or 2% milk, cream cheese, sour cream, or cheese    High-fat cuts of beef (T-bone steaks, ribs), chicken or [place] with skin, and organ meats such as liver    Butter, stick margarine, shortening, and cooking oils such as coconut or palm oil    Foods and liquids high in sodium:      Packaged foods, such as frozen dinners, cookies, macaroni and cheese, and cereals with more than 300 mg of sodium per serving    Vegetables with added sodium, such as instant potatoes, vegetables with added sauces, or regular canned vegetables    Cured or smoked meats, such as hot dogs, bacon, and sausage    High-sodium ketchup, barbecue sauce, salad dressing, pickles, olives, soy sauce, or miso    Foods and liquids high in sugar:      Candy, cake, cookies, pies, or doughnuts    Soft drinks (soda), sports drinks, or sweetened tea    Canned or dry mixes for cakes, soups, sauces, or gravies    Other healthy heart guidelines:   Do not smoke.  Nicotine and other chemicals in cigarettes and cigars can cause lung and heart damage. Ask your healthcare provider for information if you currently smoke and need help to quit. E-cigarettes or smokeless tobacco still contain nicotine. Talk to your healthcare provider before you use these products.     Limit or do not drink alcohol as directed.  Alcohol can damage your heart and raise your blood pressure. Your healthcare provider may give you specific daily and weekly limits. The general recommended limit is 1 drink a day for women 21 or older and for men 10 or older. Do not have more than 3 drinks in a day or 7 in a week. The recommended limit is 2 drinks a day for men 78 to 61 years of age. Do not have more than 4 drinks in a day or 14 in a week. A drink of alcohol is 12 ounces of beer, 5 ounces of wine, or 1 ounces of  liquor.    Exercise regularly.  Exercise can help you maintain a healthy weight and improve your blood pressure and cholesterol levels. Regular exercise can also decrease your risk for heart problems. Ask your healthcare provider about the best exercise plan for you. Do not start an exercise program without asking your healthcare provider.       Follow up with your doctor or cardiologist as directed:  Write down your questions so you remember to ask them during your visits.   Copyright [employer]  Information is for End User's use only and may not be sold, redistributed or otherwise used for commercial purposes. All illustrations and images included in CareNotes are the copyrighted property of [employer]D.A[employer]M., Inc[employer] or [employer] [facility]  The above information is an educational aid only. It is not intended as medical advice for individual conditions or treatments. Talk to your doctor, nurse or pharmacist before following any medical regimen to see if it is safe and effective for you.    Follow-up: Follow up for Yearly Check-up. , return sooner if needed.      Signed:  Charle Saucier, MD          [1]   Allergies  Allergen Reactions    Amoxicillin Nausea And Vomiting    Cat Dander Other (See Comments)     Eyes swell.    [2]   Social History  Socioeconomic History    Marital status: Married   Tobacco Use    Smoking status: Never    Smokeless tobacco: Never   Substance and Sexual Activity    Alcohol use: Yes     Alcohol/week: 5.0 standard drinks of alcohol     Types: 5 Shots of liquor per week     Comment: 5 glasses of bourbon a week.    Drug use: Never    Sexual activity: Yes     Partners: Female     Birth control/protection: None

## 2023-07-20 ENCOUNTER — Other Ambulatory Visit: Payer: Self-pay | Admitting: Family Medicine

## 2023-07-20 NOTE — Telephone Encounter (Signed)
 Last Appointment:  07/19/2023    Next Appointment: Visit date not found      No results for input(s): WBC, HGB, HCT, RBC, PLT in the last 8760 hours.     Lab Results   Component Value Date    HA1C 6.1 (H) 07/13/2022     No results for input(s): TSH in the last 8760 hours.    No results for input(s): NA, K, CL, CO2, UN, CREAT, GFRC, GFRB, GLU, CA, TP, ALB, ALT, AST, ALK, TB in the last 8760 hours.

## 2023-07-29 ENCOUNTER — Encounter: Payer: Self-pay | Admitting: Internal Medicine

## 2023-07-29 ENCOUNTER — Encounter: Payer: Self-pay | Admitting: Dermatology

## 2023-08-03 ENCOUNTER — Encounter: Payer: Self-pay | Admitting: Family Medicine

## 2023-08-18 ENCOUNTER — Other Ambulatory Visit: Payer: Self-pay

## 2023-08-19 ENCOUNTER — Encounter: Payer: Self-pay | Admitting: Urology

## 2023-08-23 ENCOUNTER — Encounter: Payer: Self-pay | Admitting: Dermatology

## 2023-08-23 ENCOUNTER — Telehealth: Payer: Self-pay | Admitting: Internal Medicine

## 2023-08-23 NOTE — Telephone Encounter (Signed)
 Appointment Cancelled & Did Not Reschedule- FYI     Date of Appt: 11/12/2023    Reason for Cancel: patient called to reschedule his fuv with Dr Shawn.  Dr Shawn next avail was in December, but the patient said that doctor wanted to see him in October and did not want to wait that long.  Patient was told that a message would go out to his Diplomatic Services operational officer.  Please advise patient.   Patient's appointment has not been cancelled/

## 2023-08-23 NOTE — Telephone Encounter (Signed)
 7/28 spoke to patient/rescheduled  DONE

## 2023-09-13 ENCOUNTER — Other Ambulatory Visit
Admission: RE | Admit: 2023-09-13 | Discharge: 2023-09-13 | Disposition: A | Discharge status: Home / Self Care | Source: Ambulatory Visit | Attending: Family Medicine | Admitting: Family Medicine

## 2023-09-13 DIAGNOSIS — Z125 Encounter for screening for malignant neoplasm of prostate: Secondary | ICD-10-CM | POA: Insufficient documentation

## 2023-09-13 DIAGNOSIS — E785 Hyperlipidemia, unspecified: Secondary | ICD-10-CM | POA: Insufficient documentation

## 2023-09-13 DIAGNOSIS — I10 Essential (primary) hypertension: Secondary | ICD-10-CM | POA: Insufficient documentation

## 2023-09-13 DIAGNOSIS — Z0000 Encounter for general adult medical examination without abnormal findings: Secondary | ICD-10-CM | POA: Insufficient documentation

## 2023-09-13 DIAGNOSIS — R7303 Prediabetes: Secondary | ICD-10-CM | POA: Insufficient documentation

## 2023-09-13 LAB — COMPREHENSIVE METABOLIC PANEL
ALT: 34 U/L (ref 0–50)
AST: 28 U/L (ref 0–50)
Albumin: 4.4 g/dL (ref 3.5–5.2)
Alk Phos: 94 U/L (ref 40–130)
Anion Gap: 10 (ref 7–16)
Bilirubin,Total: 0.8 mg/dL (ref 0.0–1.2)
CO2: 26 mmol/L (ref 20–28)
Calcium: 9.8 mg/dL (ref 8.6–10.2)
Chloride: 101 mmol/L (ref 96–108)
Creatinine: 1.09 mg/dL (ref 0.67–1.17)
Glucose: 101 mg/dL — ABNORMAL HIGH (ref 60–99)
Lab: 14 mg/dL (ref 6–20)
Potassium: 4.5 mmol/L (ref 3.3–5.1)
Sodium: 137 mmol/L (ref 133–145)
Total Protein: 7.1 g/dL (ref 6.3–7.7)
eGFR BY CREAT: 77

## 2023-09-13 LAB — CBC
Hematocrit: 44 % (ref 37–52)
Hemoglobin: 14.6 g/dL (ref 12.0–17.0)
MCV: 91 fL (ref 75–100)
Platelets: 237 THOU/uL (ref 150–450)
RBC: 4.8 MIL/uL (ref 4.0–6.0)
RDW: 12.9 % (ref 0.0–15.0)
WBC: 4.7 THOU/uL (ref 3.5–11.0)

## 2023-09-13 LAB — LIPID PANEL
Chol/HDL Ratio: 4.6
Cholesterol: 190 mg/dL
HDL: 41 mg/dL (ref 40–60)
LDL Calculated: 125 mg/dL
Non HDL Cholesterol: 149 mg/dL
Triglycerides: 133 mg/dL

## 2023-09-13 LAB — VITAMIN D: 25-OH Vit Total: 29 ng/mL — ABNORMAL LOW (ref 30–60)

## 2023-09-13 LAB — MULTIPLE ORDERING DOCS

## 2023-09-13 LAB — VITAMIN B12: Vitamin B12: 333 pg/mL (ref 232–1245)

## 2023-09-13 LAB — HEMOGLOBIN A1C: Hemoglobin A1C: 6.2 % — ABNORMAL HIGH (ref <=5.6)

## 2023-09-13 LAB — PSA (EFF.4-2010): PSA (eff. 4-2010): 1.03 ng/mL (ref 0.00–4.00)

## 2023-09-14 ENCOUNTER — Encounter: Payer: Self-pay | Admitting: Family Medicine

## 2023-09-14 DIAGNOSIS — E785 Hyperlipidemia, unspecified: Secondary | ICD-10-CM

## 2023-09-14 DIAGNOSIS — Z125 Encounter for screening for malignant neoplasm of prostate: Secondary | ICD-10-CM

## 2023-09-14 DIAGNOSIS — R7303 Prediabetes: Secondary | ICD-10-CM

## 2023-11-12 ENCOUNTER — Ambulatory Visit: Admitting: Internal Medicine

## 2023-11-22 ENCOUNTER — Ambulatory Visit: Admitting: Internal Medicine

## 2023-11-23 ENCOUNTER — Other Ambulatory Visit: Payer: Self-pay | Admitting: Family Medicine

## 2023-11-23 NOTE — Telephone Encounter (Signed)
 Last Appointment:  6/23/2025Next Appointment: Visit date not found  Lab results: 08/18/250918 WBC 4.7 Hemoglobin 14.6 Hematocrit 44 RBC 4.8 Platelets 237  Lab Results Component Value Date  HA1C 6.2 (H) 09/13/2023 No results for input(s): TSH in the last 8760 hours.  Lab results: 08/18/250918 Sodium 137 Potassium 4.5 Chloride 101 CO2 26 UN 14 Creatinine 1.09 Glucose 101* Calcium  9.8 Total Protein 7.1 Albumin 4.4 ALT 34 AST 28 Alk Phos 94 Bilirubin,Total 0.8

## 2023-11-29 NOTE — Progress Notes (Signed)
 Cardiology Office Visit NoteName: Harry Fleming Date of Visit: 12/03/2023 DOB: [birth year] PCP: Ellan Brooke, MD Reason for Today's Visit: Follow-upHistory of Present Illness Harry Fleming is a 63 y.o. male patient with pertinent medical history of: # PFO s/p Amplatzer occluder device (2017)# Hypertension# Hyperlipidemia # Prior TIA/CVA# RCC s/p partial nephrectomy Harry Fleming presents today in follow-up.  He reports that he has been doing well from a cardiovascular standpoint and exercises on a rower 2 days weekly without limitations.  He is happy to report that he has a second grandson on the way this upcoming spring.Past Medical History The patient's medical and surgical history were reviewed and pertinent history is included in the HPI as above. Medications and Allergies Current Outpatient Medications Medication Sig  atorvastatin  (LIPITOR) 80 mg tablet Take 1 tablet (80 mg total) by mouth daily.  valsartan  (DIOVAN ) 160 MG tablet TAKE 1 TABLET(160 MG) BY MOUTH DAILY  ketoconazole  (NIZORAL ) 2 % shampoo for Tinea Versicolor. Apply to wet scalp, lather, leave on 3 to 5 minutes, and rinse. Use at least 2-3 times weekly.  aspirin 81 mg EC tablet Take 1 tablet (81 mg total) by mouth daily. Allergies Allergen Reactions  Amoxicillin Nausea And Vomiting  Cat Dander Other (See Comments)   Eyes swell.  Social and Family History He reports that he has never smoked. He has never used smokeless tobacco. He reports current alcohol use of about 5.0 standard drinks of alcohol per week. He reports being sexually active and has had partner(s) who are male. He reports using the following method of birth control/protection: None. He reports that he does not use drugs.Family History Problem Relation Name Age of Onset  High cholesterol Father    Hypertension Father    Cancer Father        Renal cell carcinoma  Kidney cancer Father  59      died from  mets  Cancer Paternal Grandmother  47 - 1      gallbladder- died at age 39  High cholesterol Paternal Grandfather    Hypertension Paternal Grandfather    Pancreatic Cancer Paternal Grandfather  11  Pancreatic Cancer Maternal Aunt  60 - 69 Review of Systems ROS: 10 of 14 systems reviewed and pertinent positives and negatives documented as above in HPI; otherwise negative. Vitals and Physical Exam BP 148/86 (BP Location: Left arm, Patient Position: Sitting, Cuff Size: large adult) Comment: pt reports normal at home Comment (Cuff Size): long  Pulse 69   Ht 1.829 m (6')   Wt 105.5 kg (232 lb 9.6 oz)   SpO2 96%   BMI 31.55 kg/m  Body mass index is 31.55 kg/m.Constitutional: Appears well, no acute distress. Cardiovascular: RRR, normal s1s2 appreciated.   No murmurs.  No lower extremity edema.  2+ radial pulses. Respiratory: Breathing comfortably on room air.   CTAB.   No wheezes, rhonchi, rales.Skin: No rashes, lesions on exposed skin. Neurologic: Awake, alert, answering questions appropriately.   Moving all extremities. Cardiac Studies  ECHO COMPLETE 09/13/2024Interpretation SummaryNormal LV size/mass and LVEF without significant regional wall motion abnormalities.  Calculated LVEF is 61%.Mild left atrial enlargement.Normal RV size/function.  Estimated normal RV systolic pressure.Mild aortic valvular sclerosis without stenosis but mild insufficiency.S/p Amplatzer occluder device without evidence of residual shunt.  Agitated saline contrast study is negative for right to left shunt.Recent laboratory data independently reviewed.Impression and Plan Harry Fleming is a 63 y.o. male patient with medical history most pertinent for PFO s/p Amplatzer occluder device, hypertension, hyperlipidemia, prior TIA/CVA, and prior  RCC s/p partial nephrectomy presenting in follow-up. # PFO s/p Amplatzer occluder:  - Continue aspirin 81 mg daily#  Essential hypertension: Goal blood pressure < 130/80 based on patient's risk profile.  At goal on current therapy based on home readings.  - Continue valsartan  160 mg daily# Hyperlipidemia: Goal LDL < 70 based on patient's history.  Above goal on current therapy.  Will increase atorvastatin . - Increase atorvastatin  to 80 mg daily - Repeat lipid panel in 3 monthsFollow-up: 1 yearThank you for the opportunity to participate in the care of your patient.  Please do not hesitate to contact me ([email or web address] if you have any further questions or concerns.Toribio Dauphin, MD11/07/2023 9:53 AM

## 2023-12-03 ENCOUNTER — Encounter: Payer: Self-pay | Admitting: Internal Medicine

## 2023-12-03 ENCOUNTER — Ambulatory Visit: Admitting: Internal Medicine

## 2023-12-03 ENCOUNTER — Other Ambulatory Visit: Payer: Self-pay

## 2023-12-03 VITALS — BP 148/86 | HR 69 | Ht 72.0 in | Wt 232.6 lb

## 2023-12-03 DIAGNOSIS — E785 Hyperlipidemia, unspecified: Secondary | ICD-10-CM

## 2023-12-03 DIAGNOSIS — Z8774 Personal history of (corrected) congenital malformations of heart and circulatory system: Secondary | ICD-10-CM

## 2023-12-03 DIAGNOSIS — I10 Essential (primary) hypertension: Secondary | ICD-10-CM

## 2023-12-03 MED ORDER — ATORVASTATIN CALCIUM 80 MG PO TABS *I*
80.0000 mg | ORAL_TABLET | Freq: Every day | ORAL | 3 refills | Status: AC
Start: 2023-12-03 — End: ?

## 2023-12-03 NOTE — Patient Instructions (Addendum)
 Increase your atorvastatin  to 80 mg daily.  You can take two of your 40 mg tablets to make 80 mg.  I did send a new prescription for 80 mg tablets, so when you pick this up, please be sure to take one 80 mg tablet daily.  Get fasting blood work to recheck cholesterol a few months thereafter.

## 2023-12-08 ENCOUNTER — Encounter: Payer: Self-pay | Admitting: Family Medicine

## 2023-12-08 DIAGNOSIS — J40 Bronchitis, not specified as acute or chronic: Secondary | ICD-10-CM

## 2023-12-17 MED ORDER — AZITHROMYCIN 250 MG PO TABS *I*
ORAL_TABLET | ORAL | 0 refills | Status: AC
Start: 2023-12-17 — End: ?

## 2023-12-17 MED ORDER — GUAIFENESIN-CODEINE 100-10 MG/5ML PO SYRP *I*
5.0000 mL | ORAL_SOLUTION | Freq: Two times a day (BID) | ORAL | 0 refills | Status: AC | PRN
Start: 2023-12-17 — End: ?

## 2023-12-17 MED ORDER — PREDNISONE 20 MG PO TABS *I*
40.0000 mg | ORAL_TABLET | Freq: Every day | ORAL | 0 refills | Status: AC
Start: 2023-12-17 — End: 2023-12-22

## 2023-12-17 MED ORDER — ALBUTEROL SULFATE HFA 108 (90 BASE) MCG/ACT IN AERS *I*: 1.0000 | INHALATION_SPRAY | Freq: Four times a day (QID) | RESPIRATORY_TRACT | 0 refills | Status: DC | PRN

## 2023-12-17 MED ORDER — BENZONATATE 200 MG PO CAPS *I*
200.0000 mg | ORAL_CAPSULE | Freq: Three times a day (TID) | ORAL | 0 refills | Status: AC | PRN
Start: 2023-12-17 — End: ?

## 2023-12-17 MED ORDER — FLUTICASONE-SALMETEROL 250-50 MCG/ACT IN AEPB *I*: 1.0000 | INHALATION_SPRAY | Freq: Two times a day (BID) | RESPIRATORY_TRACT | 0 refills | Status: DC

## 2023-12-20 ENCOUNTER — Other Ambulatory Visit: Payer: Self-pay

## 2023-12-20 ENCOUNTER — Ambulatory Visit
Admission: RE | Admit: 2023-12-20 | Discharge: 2023-12-20 | Disposition: A | Discharge status: Home / Self Care | Source: Ambulatory Visit | Attending: Family Medicine | Admitting: Family Medicine

## 2023-12-20 DIAGNOSIS — J40 Bronchitis, not specified as acute or chronic: Secondary | ICD-10-CM | POA: Insufficient documentation

## 2023-12-20 MED ORDER — LEVOFLOXACIN 500 MG PO TABS *I*
500.0000 mg | ORAL_TABLET | Freq: Every day | ORAL | 0 refills | Status: AC
Start: 2023-12-20 — End: 2023-12-30

## 2023-12-20 NOTE — Addendum Note (Signed)
 Addended byBETHA ELLAN BROOKE on: 12/20/2023 12:42 PM Modules accepted: Orders

## 2023-12-25 ENCOUNTER — Encounter: Payer: Self-pay | Admitting: Urology

## 2023-12-25 DIAGNOSIS — C642 Malignant neoplasm of left kidney, except renal pelvis: Secondary | ICD-10-CM

## 2023-12-27 ENCOUNTER — Other Ambulatory Visit: Payer: BLUE CROSS/BLUE SHIELD

## 2024-01-08 ENCOUNTER — Other Ambulatory Visit: Payer: Self-pay | Admitting: Family Medicine

## 2024-01-14 ENCOUNTER — Other Ambulatory Visit: Payer: Self-pay | Admitting: Family Medicine

## 2024-01-14 NOTE — Telephone Encounter (Signed)
 Refill requestMedication: Confirm that the patient wants the medication sent to: [facility] [phone] - [place], [place] - [address] AT [facility] [address] [facility] [address] & [phone] [place] [id number]: [phone] Fax: [phone]  Additional message: Relevant labs:Last Office Visit   Date Provider Department Visit Type Primary Dx  07/19/2023 Ellan Brooke, MD [facility] Office Visit Annual physical exam    Last Telemedicine Visit  None Last PA Office Visit  None  Next visit: Future Encounters    07/24/2024  9:40 AM [facility]  PHYSICAL  Authorization not required  [facility]  Ellan Brooke, MD       Additional information to be completed by clinical staff:If the request is for a controlled substance please paste the iStop results below:**If the refill protocol indicates they are due for labs please order them and tell the patient to have them drawn**

## 2024-01-23 ENCOUNTER — Encounter: Payer: Self-pay | Admitting: Internal Medicine

## 2024-02-01 MED ORDER — AMLODIPINE BESYLATE-VALSARTAN 5-320 MG PO TABS *A*: 1.0000 | ORAL_TABLET | Freq: Every day | ORAL | 3 refills | Status: AC

## 2024-02-10 ENCOUNTER — Other Ambulatory Visit

## 2024-02-18 ENCOUNTER — Telehealth: Payer: Self-pay

## 2024-02-19 ENCOUNTER — Ambulatory Visit
Admission: RE | Admit: 2024-02-19 | Discharge: 2024-02-19 | Disposition: A | Discharge status: Home / Self Care | Source: Ambulatory Visit

## 2024-02-19 ENCOUNTER — Other Ambulatory Visit: Payer: Self-pay

## 2024-02-19 DIAGNOSIS — C642 Malignant neoplasm of left kidney, except renal pelvis: Secondary | ICD-10-CM

## 2024-02-19 DIAGNOSIS — Z905 Acquired absence of kidney: Secondary | ICD-10-CM | POA: Insufficient documentation

## 2024-02-19 DIAGNOSIS — Z85528 Personal history of other malignant neoplasm of kidney: Secondary | ICD-10-CM | POA: Insufficient documentation

## 2024-02-19 DIAGNOSIS — N281 Cyst of kidney, acquired: Secondary | ICD-10-CM | POA: Insufficient documentation

## 2024-02-19 MED ORDER — GADOPICLENOL 0.5 MMOL/ML (VUEWAY) IV SOLN *I*
10.0000 mL | Freq: Once | INTRAVENOUS | Status: AC
  Administered 2024-02-19: 10 mL via INTRAVENOUS

## 2024-02-23 ENCOUNTER — Ambulatory Visit: Payer: Self-pay

## 2024-03-03 ENCOUNTER — Other Ambulatory Visit: Payer: BLUE CROSS/BLUE SHIELD

## 2024-03-07 ENCOUNTER — Ambulatory Visit

## 2024-03-07 ENCOUNTER — Ambulatory Visit: Payer: BLUE CROSS/BLUE SHIELD | Admitting: Urology

## 2024-07-24 ENCOUNTER — Encounter: Admitting: Family Medicine
